# Patient Record
Sex: Male | Born: 1980 | Race: White | Hispanic: No | Marital: Single | Smoking: Current every day smoker
Health system: Southern US, Community
[De-identification: ages and names within clinical notes are randomized; demographics above are authoritative.]

## PROBLEM LIST (undated history)

## (undated) DIAGNOSIS — R319 Hematuria, unspecified: Secondary | ICD-10-CM

## (undated) DIAGNOSIS — K219 Gastro-esophageal reflux disease without esophagitis: Secondary | ICD-10-CM

## (undated) HISTORY — PX: HERNIA REPAIR: SHX51

---

## 2009-11-11 ENCOUNTER — Ambulatory Visit: Payer: Self-pay | Admitting: Family Medicine

## 2010-12-16 ENCOUNTER — Ambulatory Visit: Payer: Self-pay | Admitting: Internal Medicine

## 2011-07-31 ENCOUNTER — Emergency Department (HOSPITAL_COMMUNITY): Payer: BC Managed Care – PPO

## 2011-07-31 ENCOUNTER — Encounter (HOSPITAL_COMMUNITY): Payer: Self-pay

## 2011-07-31 ENCOUNTER — Emergency Department (HOSPITAL_COMMUNITY)
Admission: EM | Admit: 2011-07-31 | Discharge: 2011-07-31 | Disposition: A | Discharge status: Home Health | Payer: BC Managed Care – PPO | Attending: Emergency Medicine | Admitting: Emergency Medicine

## 2011-07-31 DIAGNOSIS — K2970 Gastritis, unspecified, without bleeding: Secondary | ICD-10-CM

## 2011-07-31 DIAGNOSIS — L259 Unspecified contact dermatitis, unspecified cause: Secondary | ICD-10-CM | POA: Insufficient documentation

## 2011-07-31 DIAGNOSIS — R221 Localized swelling, mass and lump, neck: Secondary | ICD-10-CM | POA: Insufficient documentation

## 2011-07-31 DIAGNOSIS — Z79899 Other long term (current) drug therapy: Secondary | ICD-10-CM | POA: Insufficient documentation

## 2011-07-31 DIAGNOSIS — R220 Localized swelling, mass and lump, head: Secondary | ICD-10-CM | POA: Insufficient documentation

## 2011-07-31 DIAGNOSIS — R1110 Vomiting, unspecified: Secondary | ICD-10-CM | POA: Insufficient documentation

## 2011-07-31 DIAGNOSIS — R10811 Right upper quadrant abdominal tenderness: Secondary | ICD-10-CM | POA: Insufficient documentation

## 2011-07-31 DIAGNOSIS — R112 Nausea with vomiting, unspecified: Secondary | ICD-10-CM | POA: Insufficient documentation

## 2011-07-31 DIAGNOSIS — R1011 Right upper quadrant pain: Secondary | ICD-10-CM | POA: Insufficient documentation

## 2011-07-31 DIAGNOSIS — K219 Gastro-esophageal reflux disease without esophagitis: Secondary | ICD-10-CM | POA: Insufficient documentation

## 2011-07-31 DIAGNOSIS — T7840XA Allergy, unspecified, initial encounter: Secondary | ICD-10-CM

## 2011-07-31 DIAGNOSIS — F17200 Nicotine dependence, unspecified, uncomplicated: Secondary | ICD-10-CM | POA: Insufficient documentation

## 2011-07-31 HISTORY — DX: Hematuria, unspecified: R31.9

## 2011-07-31 HISTORY — DX: Gastro-esophageal reflux disease without esophagitis: K21.9

## 2011-07-31 LAB — URINALYSIS, ROUTINE W REFLEX MICROSCOPIC
Glucose, UA: NEGATIVE mg/dL
Leukocytes, UA: NEGATIVE
Nitrite: NEGATIVE
Protein, ur: NEGATIVE mg/dL
Urobilinogen, UA: 0.2 mg/dL (ref 0.0–1.0)

## 2011-07-31 LAB — DIFFERENTIAL
Basophils Absolute: 0 10*3/uL (ref 0.0–0.1)
Eosinophils Relative: 0 % (ref 0–5)
Lymphocytes Relative: 8 % — ABNORMAL LOW (ref 12–46)
Monocytes Absolute: 0.8 10*3/uL (ref 0.1–1.0)

## 2011-07-31 LAB — COMPREHENSIVE METABOLIC PANEL
AST: 36 U/L (ref 0–37)
BUN: 11 mg/dL (ref 6–23)
CO2: 27 mEq/L (ref 19–32)
Calcium: 9.9 mg/dL (ref 8.4–10.5)
Creatinine, Ser: 0.95 mg/dL (ref 0.50–1.35)
GFR calc Af Amer: >90 mL/min (ref >90)
GFR calc non Af Amer: >90 mL/min (ref >90)

## 2011-07-31 LAB — CBC
HCT: 46.1 % (ref 39.0–52.0)
MCHC: 35.4 g/dL (ref 30.0–36.0)
MCV: 89 fL (ref 78.0–100.0)
RDW: 11.9 % (ref 11.5–15.5)
WBC: 17.8 10*3/uL — ABNORMAL HIGH (ref 4.0–10.5)

## 2011-07-31 LAB — LIPASE, BLOOD: Lipase: 14 U/L (ref 11–59)

## 2011-07-31 MED ORDER — ONDANSETRON HCL 4 MG PO TABS: 4.0000 mg | ORAL_TABLET | Freq: Four times a day (QID) | ORAL | Status: AC

## 2011-07-31 MED ORDER — METHYLPREDNISOLONE SODIUM SUCC 125 MG IJ SOLR
125.0000 mg | Freq: Once | INTRAMUSCULAR | Status: AC
  Administered 2011-07-31: 125 mg via INTRAVENOUS
  Filled 2011-07-31: qty 2

## 2011-07-31 MED ORDER — MORPHINE SULFATE 4 MG/ML IJ SOLN
4.0000 mg | Freq: Once | INTRAMUSCULAR | Status: AC
  Administered 2011-07-31: 4 mg via INTRAVENOUS
  Filled 2011-07-31: qty 1

## 2011-07-31 MED ORDER — PANTOPRAZOLE SODIUM 20 MG PO TBEC: 40.0000 mg | DELAYED_RELEASE_TABLET | Freq: Every day | ORAL | Status: AC

## 2011-07-31 MED ORDER — ONDANSETRON HCL 4 MG/2ML IJ SOLN
4.0000 mg | Freq: Once | INTRAMUSCULAR | Status: AC
  Administered 2011-07-31: 4 mg via INTRAVENOUS
  Filled 2011-07-31: qty 2

## 2011-07-31 MED ORDER — FAMOTIDINE IN NACL 20-0.9 MG/50ML-% IV SOLN
20.0000 mg | Freq: Once | INTRAVENOUS | Status: AC
  Administered 2011-07-31: 20 mg via INTRAVENOUS
  Filled 2011-07-31: qty 50

## 2011-07-31 MED ORDER — DIPHENHYDRAMINE HCL 25 MG PO CAPS: 25.0000 mg | ORAL_CAPSULE | Freq: Four times a day (QID) | ORAL | Status: AC | PRN

## 2011-07-31 MED ORDER — SODIUM CHLORIDE 0.9 % IV BOLUS (SEPSIS)
1000.0000 mL | Freq: Once | INTRAVENOUS | Status: AC
  Administered 2011-07-31: 1000 mL via INTRAVENOUS

## 2011-07-31 MED ORDER — DIPHENHYDRAMINE HCL 50 MG/ML IJ SOLN
50.0000 mg | Freq: Once | INTRAMUSCULAR | Status: AC
  Administered 2011-07-31: 50 mg via INTRAVENOUS
  Filled 2011-07-31: qty 1

## 2011-07-31 MED ORDER — PREDNISONE 10 MG PO TABS: 20.0000 mg | ORAL_TABLET | Freq: Every day | ORAL | Status: DC

## 2011-07-31 NOTE — ED Provider Notes (Signed)
History     CSN: [id number]  Arrival date & time 07/31/11  4098   First MD Initiated Contact with Patient 07/31/11 0820      Chief Complaint  Patient presents with  . Rash  . Emesis  . Abdominal Pain    (Consider location/radiation/quality/duration/timing/severity/associated sxs/prior treatment) HPI Pt with rash that started on his face yesterday and has since spread to his entire body. Non-painful. No airway compromise. No new medications or exposures. No prev allergic reactions. Pt has on and off RUQ pain associated with N/V in the past and has been diagnosed wit GERD. He had a similar exacerbation this morning with RUQ pain and several episodes of vomiting. Cont to have mild RUQ pain. No fevers, diarrhea. Pain is exacerbated by eating.  Past Medical History  Diagnosis Date  . Hematuria - cause not known   . Acid reflux     Past Surgical History  Procedure Date  . Hernia repair     History reviewed. No pertinent family history.  History  Substance Use Topics  . Smoking status: Current Everyday Smoker -- 1.0 packs/day  . Smokeless tobacco: Not on file  . Alcohol Use: Yes     occasional      Review of Systems  Constitutional: Negative for fever and chills.  HENT: Positive for facial swelling. Negative for trouble swallowing.   Respiratory: Negative for chest tightness, shortness of breath and wheezing.   Cardiovascular: Negative for chest pain and leg swelling.  Gastrointestinal: Positive for nausea, vomiting and abdominal pain. Negative for diarrhea and blood in stool.  Skin: Positive for rash. Negative for wound.  Neurological: Negative for dizziness, weakness, numbness and headaches.    Allergies  Review of patient's allergies indicates no known allergies.  Home Medications   Current Outpatient Rx  Name Route Sig Dispense Refill  . RANITIDINE HCL 150 MG PO TABS Oral Take 150 mg by mouth 2 (two) times daily.      BP 131/74  Pulse 73  Temp(Src) 98 F  (36.7 C) (Oral)  Resp 20  SpO2 98%  Physical Exam  Nursing note and vitals reviewed. Constitutional: He is oriented to person, place, and time. He appears well-developed and well-nourished. No distress.  HENT:  Head: Normocephalic and atraumatic.  Mouth/Throat: Oropharynx is clear and moist. No oropharyngeal exudate.       No tongue or lip swelling  Eyes: EOM are normal. Pupils are equal, round, and reactive to light.  Neck: Normal range of motion. Neck supple.  Cardiovascular: Normal rate and regular rhythm.   Pulmonary/Chest: Effort normal and breath sounds normal. No stridor. No respiratory distress. He has no wheezes. He has no rales.  Abdominal: Soft. Bowel sounds are normal. He exhibits no mass. There is tenderness (mild RUQ tenderness to palpation). There is no rebound and no guarding.  Musculoskeletal: Normal range of motion. He exhibits no edema and no tenderness.  Neurological: He is alert and oriented to person, place, and time.       5/5 motor, sensation intact  Skin: Skin is warm and dry. Rash (blanching, erythematous macular and papular rash concentrated on face but diffuse over entire body. Mild periorbital edema) noted. No erythema.  Psychiatric: He has a normal mood and affect. His behavior is normal.    ED Course  Procedures (including critical care time)   Labs Reviewed  CBC  DIFFERENTIAL  COMPREHENSIVE METABOLIC PANEL  LIPASE, BLOOD  URINALYSIS, ROUTINE W REFLEX MICROSCOPIC   No results found.  No diagnosis found.    MDM  Pt rash has significantly improved. Pt states he is feeling better. Advised to f/u with GI. Return for concerns       Loren Racer, MD 07/31/11 1540

## 2011-07-31 NOTE — ED Notes (Signed)
Pt states that starting a couple of days ago he has broken out with a red blotchy rash all over his body. He states that it started with a rash on his face and eyelids and has now progressed all over his body. He states that he has been having the chills and sweats and has vomited a few times since 0430 this morning. He took some pain killers last night for a toothache in the left upper jaw area. The meds were tramadol and pt states that he had the rash prior to taking the meds. Pt states that he was having some similar abdominal pain on the right side and was seen at a clinic but has head no results from it. Pt is alert and oriented. Pt has hx of kidney problems. He states that he has been having right sided abdominal pain that is worse after he eats. Last po intake was last night and last bm was yesterday. His rash extends on his face and his face looks swollen. Airway intact.

## 2011-08-02 DIAGNOSIS — L509 Urticaria, unspecified: Secondary | ICD-10-CM | POA: Insufficient documentation

## 2013-03-01 IMAGING — US US ABDOMEN COMPLETE
1 series · 14 of 25 positions shown · non-contrast
Comparison: No comparison studies available.

CLINICAL DATA: Right upper quadrant pain.

ABDOMEN ULTRASOUND
TECHNIQUE: Complete abdominal ultrasound examination was performed
including evaluation of the liver, gallbladder, bile ducts,
pancreas, kidneys, spleen, IVC, and abdominal aorta.

[Series 1: us abdomen complete · 0.29mm/px · 14 of 75 slices shown]
[im 1/75]
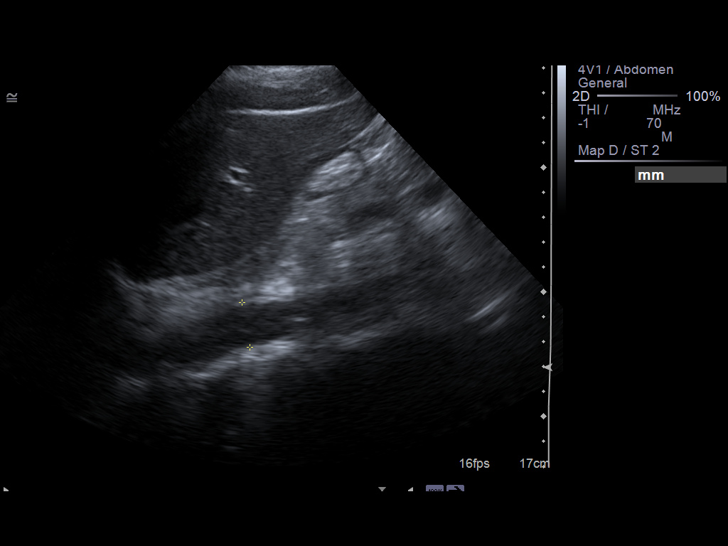
[im 7/75]
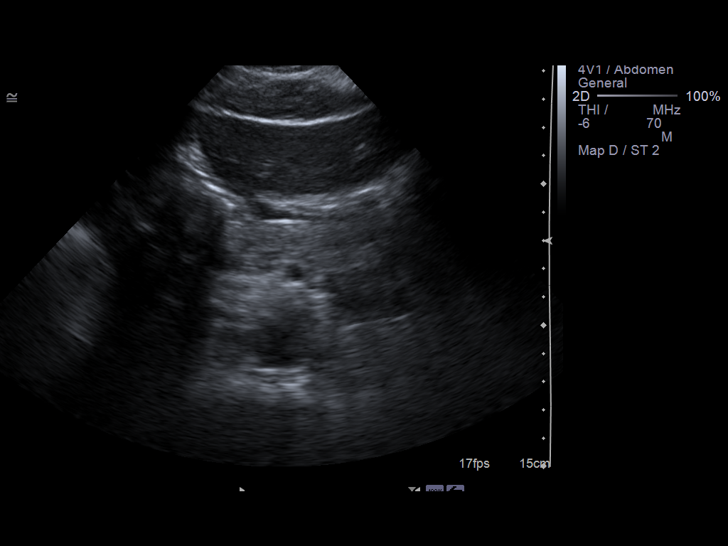
[im 13/75]
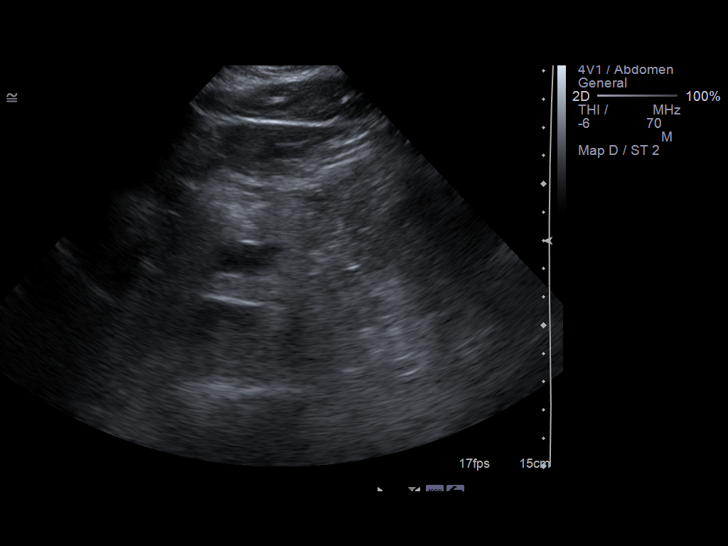
[im 19/75]
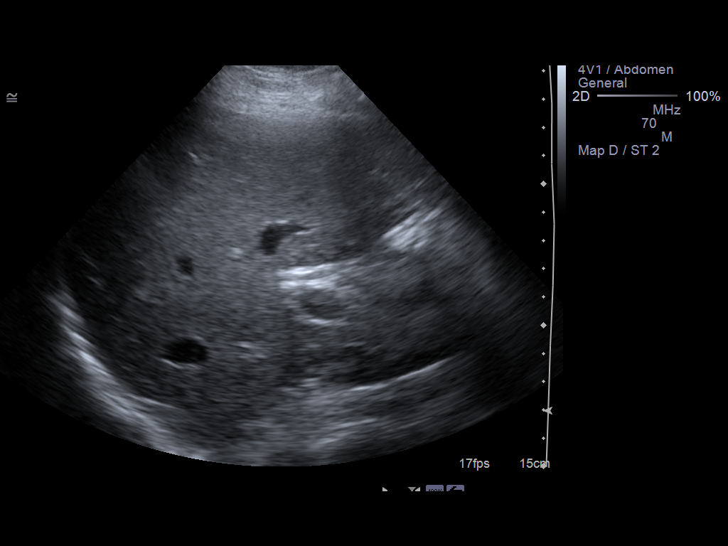
[im 25/75]
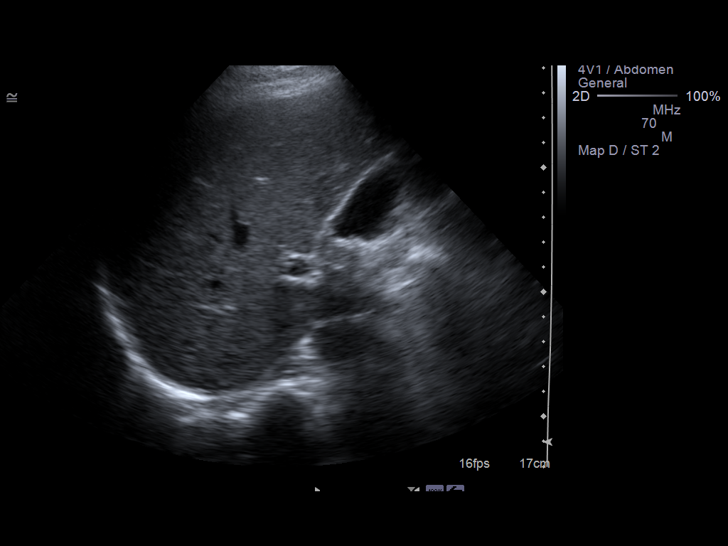
[im 28/75]
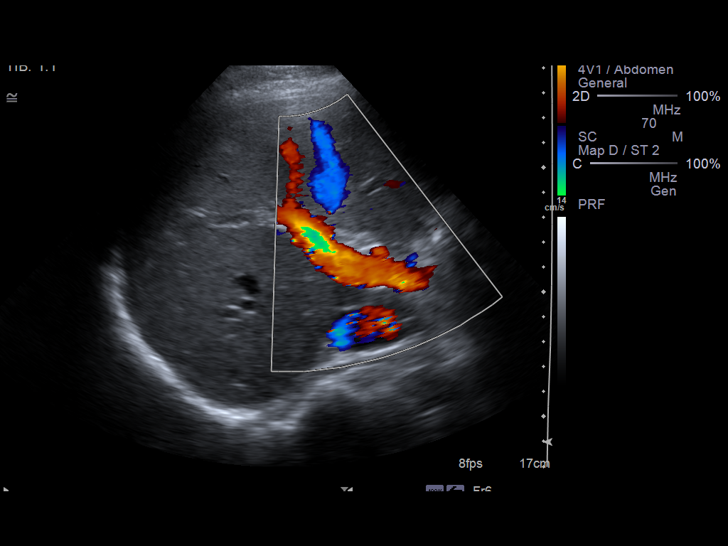
[im 34/75]
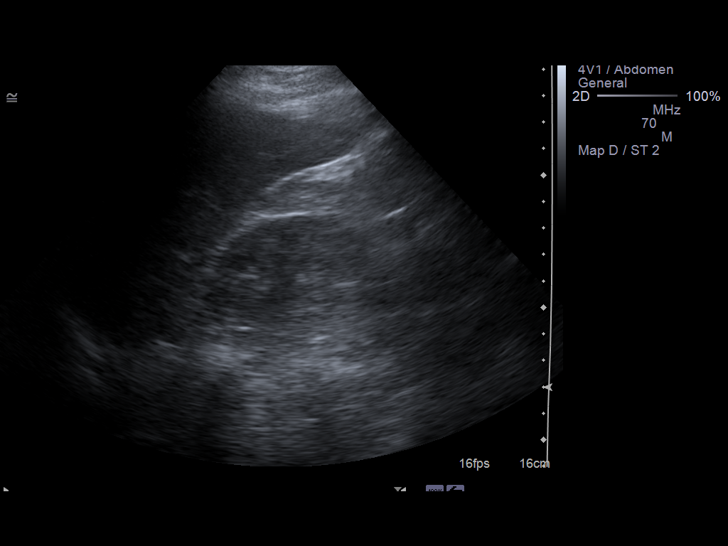
[im 41/75]
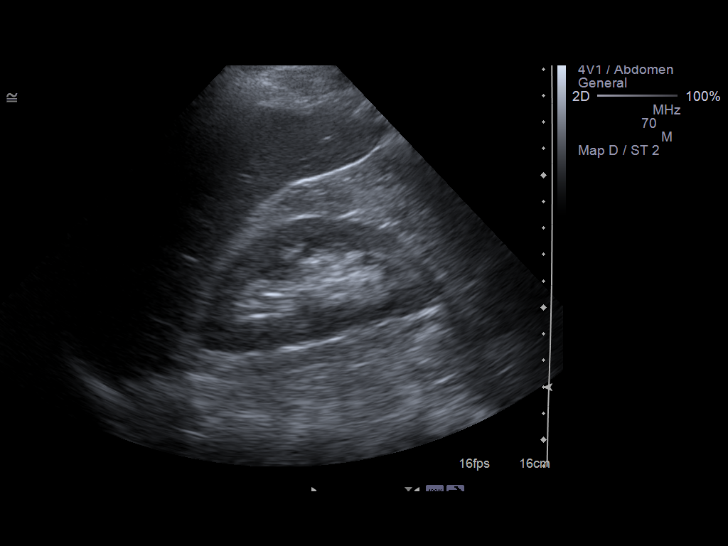
[im 47/75]
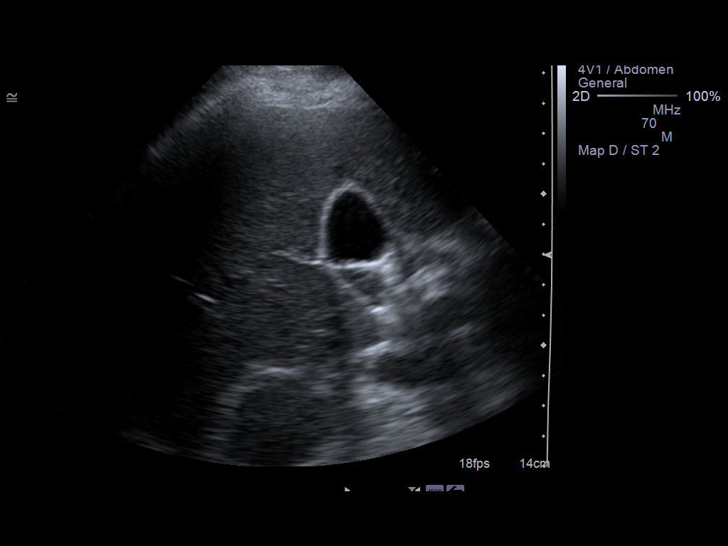
[im 50/75]
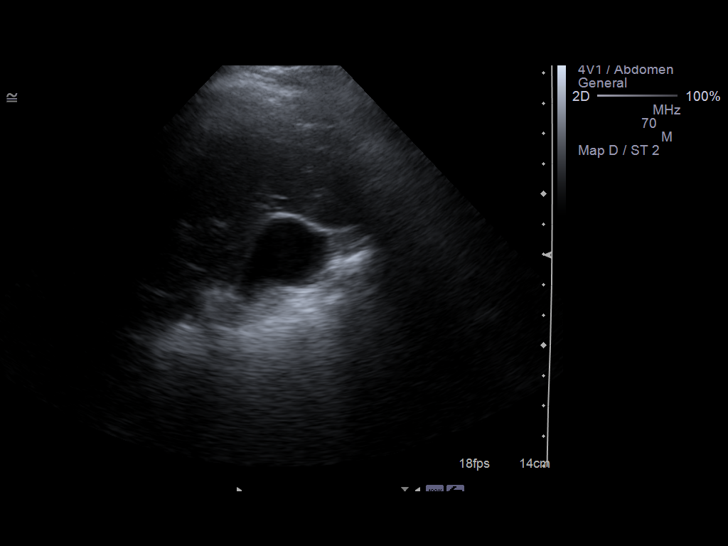
[im 56/75]
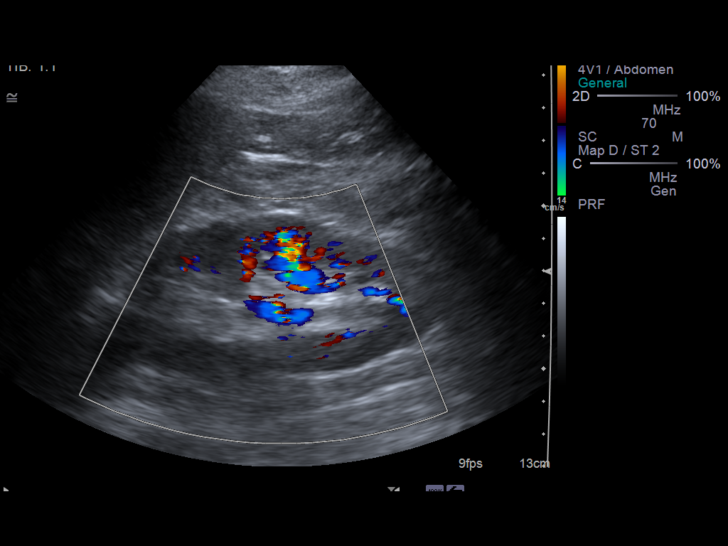
[im 62/75]
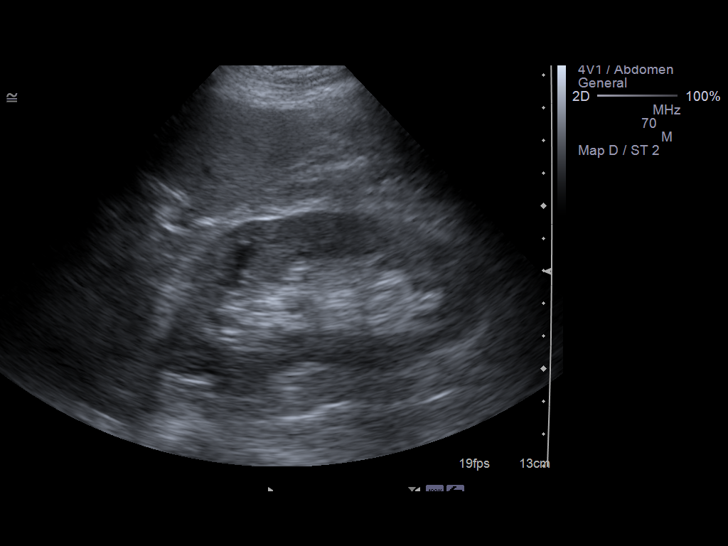
[im 68/75]
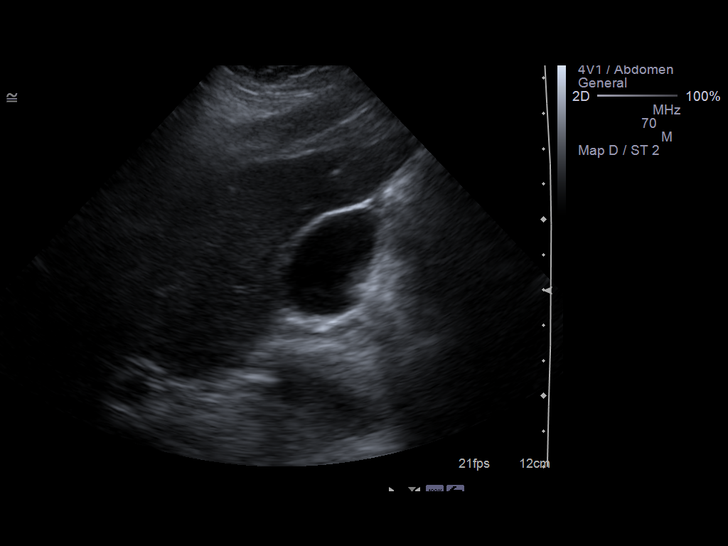
[im 75/75]
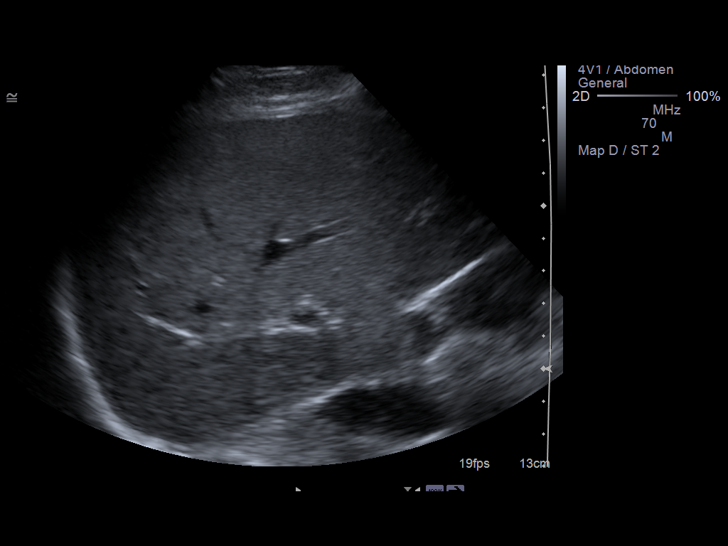

[14 of 25 positions shown; findings below may reference images not displayed]

FINDINGS: Gallbladder:  There is no evidence for gallstones.  No gallbladder
wall thickening or pericholecystic fluid.  The sonographer reports
no sonographic Murphy's sign.

Common Bile Duct:  Nondilated at 4 mm diameter.

Liver:  Normal.  No focal parenchymal abnormality.  No biliary
dilation.

IVC:  Normal.

Pancreas:  Tail is obscured by overlying bowel gas.  Otherwise
unremarkable.

Spleen:  Normal.

Right kidney:  10.0 cm in long axis.  Normal.

Left kidney:  9.7 cm in long axis.  Normal.

Abdominal Aorta:  No aneurysm.
IMPRESSION: Unremarkable abdominal ultrasound.  No findings to explain the
patient's history of right upper quadrant pain.

## 2015-05-24 ENCOUNTER — Encounter (HOSPITAL_COMMUNITY): Payer: Self-pay | Admitting: Emergency Medicine

## 2015-05-24 ENCOUNTER — Emergency Department (HOSPITAL_COMMUNITY)
Admission: EM | Admit: 2015-05-24 | Discharge: 2015-05-24 | Disposition: A | Discharge status: Home / Self Care | Payer: BLUE CROSS/BLUE SHIELD | Attending: Emergency Medicine | Admitting: Emergency Medicine

## 2015-05-24 DIAGNOSIS — Z880 Allergy status to penicillin: Secondary | ICD-10-CM | POA: Diagnosis not present

## 2015-05-24 DIAGNOSIS — M791 Myalgia: Secondary | ICD-10-CM | POA: Diagnosis not present

## 2015-05-24 DIAGNOSIS — K219 Gastro-esophageal reflux disease without esophagitis: Secondary | ICD-10-CM | POA: Diagnosis not present

## 2015-05-24 DIAGNOSIS — L519 Erythema multiforme, unspecified: Secondary | ICD-10-CM | POA: Diagnosis not present

## 2015-05-24 DIAGNOSIS — R000 Tachycardia, unspecified: Secondary | ICD-10-CM | POA: Insufficient documentation

## 2015-05-24 DIAGNOSIS — F17200 Nicotine dependence, unspecified, uncomplicated: Secondary | ICD-10-CM | POA: Insufficient documentation

## 2015-05-24 DIAGNOSIS — E860 Dehydration: Secondary | ICD-10-CM | POA: Diagnosis not present

## 2015-05-24 DIAGNOSIS — R21 Rash and other nonspecific skin eruption: Secondary | ICD-10-CM | POA: Diagnosis present

## 2015-05-24 LAB — CBC WITH DIFFERENTIAL/PLATELET
BASOS ABS: 0 10*3/uL (ref 0.0–0.1)
Basophils Relative: 0 %
EOS PCT: 0 %
Eosinophils Absolute: 0 10*3/uL (ref 0.0–0.7)
HCT: 44.6 % (ref 39.0–52.0)
Hemoglobin: 15.1 g/dL (ref 13.0–17.0)
LYMPHS ABS: 1.5 10*3/uL (ref 0.7–4.0)
LYMPHS PCT: 11 %
MCH: 30.9 pg (ref 26.0–34.0)
MCHC: 33.9 g/dL (ref 30.0–36.0)
MCV: 91.4 fL (ref 78.0–100.0)
MONO ABS: 0.3 10*3/uL (ref 0.1–1.0)
MONOS PCT: 2 %
Neutro Abs: 12.3 10*3/uL — ABNORMAL HIGH (ref 1.7–7.7)
Neutrophils Relative %: 87 %
PLATELETS: 341 10*3/uL (ref 150–400)
RBC: 4.88 MIL/uL (ref 4.22–5.81)
RDW: 13 % (ref 11.5–15.5)
WBC: 14.1 10*3/uL — ABNORMAL HIGH (ref 4.0–10.5)

## 2015-05-24 LAB — COMPREHENSIVE METABOLIC PANEL
ALT: 89 U/L — ABNORMAL HIGH (ref 17–63)
ANION GAP: 16 — AB (ref 5–15)
AST: 43 U/L — ABNORMAL HIGH (ref 15–41)
Albumin: 3.7 g/dL (ref 3.5–5.0)
Alkaline Phosphatase: 52 U/L (ref 38–126)
BUN: 19 mg/dL (ref 6–20)
CHLORIDE: 95 mmol/L — AB (ref 101–111)
CO2: 22 mmol/L (ref 22–32)
Calcium: 9.3 mg/dL (ref 8.9–10.3)
Creatinine, Ser: 1.46 mg/dL — ABNORMAL HIGH (ref 0.61–1.24)
Glucose, Bld: 107 mg/dL — ABNORMAL HIGH (ref 65–99)
POTASSIUM: 5.1 mmol/L (ref 3.5–5.1)
Sodium: 133 mmol/L — ABNORMAL LOW (ref 135–145)
TOTAL PROTEIN: 6.4 g/dL — AB (ref 6.5–8.1)
Total Bilirubin: 2.6 mg/dL — ABNORMAL HIGH (ref 0.3–1.2)

## 2015-05-24 LAB — C-REACTIVE PROTEIN: CRP: 20 mg/dL — AB (ref <1.0)

## 2015-05-24 LAB — SEDIMENTATION RATE: SED RATE: 18 mm/h — AB (ref 0–16)

## 2015-05-24 LAB — RAPID STREP SCREEN (MED CTR MEBANE ONLY): Streptococcus, Group A Screen (Direct): NEGATIVE

## 2015-05-24 MED ORDER — FAMOTIDINE IN NACL 20-0.9 MG/50ML-% IV SOLN
20.0000 mg | Freq: Once | INTRAVENOUS | Status: AC
  Administered 2015-05-24: 20 mg via INTRAVENOUS
  Filled 2015-05-24: qty 50

## 2015-05-24 MED ORDER — FAMOTIDINE 40 MG PO TABS: 40.0000 mg | ORAL_TABLET | Freq: Every day | ORAL | Status: DC

## 2015-05-24 MED ORDER — KETOROLAC TROMETHAMINE 30 MG/ML IJ SOLN
30.0000 mg | Freq: Once | INTRAMUSCULAR | Status: AC
  Administered 2015-05-24: 30 mg via INTRAVENOUS
  Filled 2015-05-24: qty 1

## 2015-05-24 MED ORDER — DIPHENHYDRAMINE HCL 50 MG/ML IJ SOLN
25.0000 mg | Freq: Once | INTRAMUSCULAR | Status: AC
  Administered 2015-05-24: 25 mg via INTRAVENOUS
  Filled 2015-05-24: qty 1

## 2015-05-24 MED ORDER — PREDNISONE 20 MG PO TABS: ORAL_TABLET | ORAL | Status: DC

## 2015-05-24 MED ORDER — HYDROXYZINE HCL 25 MG PO TABS: 25.0000 mg | ORAL_TABLET | Freq: Four times a day (QID) | ORAL | Status: DC | PRN

## 2015-05-24 MED ORDER — MORPHINE SULFATE (PF) 4 MG/ML IV SOLN
4.0000 mg | Freq: Once | INTRAVENOUS | Status: AC
  Administered 2015-05-24: 4 mg via INTRAVENOUS
  Filled 2015-05-24: qty 1

## 2015-05-24 MED ORDER — METHYLPREDNISOLONE SODIUM SUCC 125 MG IJ SOLR
125.0000 mg | Freq: Once | INTRAMUSCULAR | Status: AC
  Administered 2015-05-24: 125 mg via INTRAVENOUS
  Filled 2015-05-24: qty 2

## 2015-05-24 MED ORDER — SODIUM CHLORIDE 0.9 % IV BOLUS (SEPSIS)
1000.0000 mL | Freq: Once | INTRAVENOUS | Status: AC
  Administered 2015-05-24: 1000 mL via INTRAVENOUS

## 2015-05-24 NOTE — ED Provider Notes (Signed)
CSN: [id number]     Arrival date & time 05/24/15  0030 History  By signing my name below, I, Christopher Chan, attest that this documentation has been prepared under the direction and in the presence of Christopher Rice, MD. Electronically Signed: Hansel Chan, ED Scribe. 05/24/2015. 1:31 AM.    Chief Complaint  Patient presents with  . Urticaria  . Allergic Reaction   The history is provided by the patient. No language interpreter was used.    HPI Comments: Christopher Chan is a 35 y.o. male who presents to the Emergency Department complaining of moderate, worsening pruritic and painful generalized rash onset 2 days ago with associated generalized arthralgias and swelling of the hands, chills. Per pt, the rash started at his lower back. Pt states he was seen in the ER in [place] for the rash 2 days ago and was given Benadryl and Pepcid with temporary relief. He notes that he has been continuously taking Benadryl since onset with little to no relief. Pt states h/o similar symptoms with no known causes. No recent outdoor exposure or tick bites. No new soaps, lotions, detergents, foods, animals, plants. Pt notes he was dx with otitis media 2 weeks ago and finished his rx of amoxicillin 4 days ago. He denies fever, sore throat, throat swelling, SOB.   Past Medical History  Diagnosis Date  . Hematuria - cause not known   . Acid reflux    Past Surgical History  Procedure Laterality Date  . Hernia repair     No family history on file. Social History  Substance Use Topics  . Smoking status: Current Every Day Smoker -- 1.00 packs/day  . Smokeless tobacco: None  . Alcohol Use: Yes     Comment: occasional    Review of Systems  Constitutional: Positive for chills and fatigue. Negative for fever.  HENT: Negative for sore throat, trouble swallowing and voice change.   Respiratory: Negative for chest tightness, shortness of breath, wheezing and stridor.   Cardiovascular: Negative for chest pain.   Gastrointestinal: Negative for nausea, vomiting, abdominal pain, diarrhea, constipation and blood in stool.  Genitourinary: Negative for dysuria, frequency, hematuria and flank pain.  Musculoskeletal: Positive for myalgias, joint swelling and arthralgias. Negative for back pain, neck pain and neck stiffness.  Skin: Positive for rash. Negative for pallor and wound.  Neurological: Negative for dizziness, weakness, light-headedness, numbness and headaches.  All other systems reviewed and are negative.  Allergies  Amoxicillin  Home Medications   Prior to Admission medications   Medication Sig Start Date End Date Taking? Authorizing Provider  diphenhydrAMINE (BENADRYL) 25 MG tablet Take 50 mg by mouth every 4 (four) hours as needed for itching.   Yes Historical Provider, MD  EPINEPHrine 0.3 mg/0.3 mL IJ SOAJ injection Inject 0.3 mg into the muscle once.   Yes Historical Provider, MD  famotidine (PEPCID) 40 MG tablet Take 1 tablet (40 mg total) by mouth daily. 05/24/15   Christopher Rice, MD  hydrOXYzine (ATARAX/VISTARIL) 25 MG tablet Take 1 tablet (25 mg total) by mouth every 6 (six) hours as needed for itching. 05/24/15   Christopher Rice, MD  predniSONE (DELTASONE) 20 MG tablet 3 tabs po day one, then 2 po daily x 4 days 05/24/15   Christopher Rice, MD   BP 108/67 mmHg  Pulse 69  Temp(Src) 98.8 F (37.1 C) (Oral)  Resp 18  Ht 5\' 9"  (1.753 m)  Wt 182 lb (82.555 kg)  BMI 26.86 kg/m2  SpO2 97% Physical Exam  Constitutional: He is oriented to person, place, and time. He appears well-developed and well-nourished. No distress.  HENT:  Head: Normocephalic and atraumatic.  Mouth/Throat: Oropharynx is clear and moist.  Erythematous oropharynx without tonsillar hypertrophy or exudates. Uvula is midline. No facial or intraoral swelling  Eyes: EOM are normal. Pupils are equal, round, and reactive to light.  Neck: Normal range of motion. Neck supple.  Cardiovascular: Regular rhythm.  Exam reveals no  gallop and no friction rub.   No murmur heard. Tachycardia  Pulmonary/Chest: Effort normal and breath sounds normal. No stridor. No respiratory distress. He has no wheezes. He has no rales. He exhibits no tenderness.  Abdominal: Soft. Bowel sounds are normal. He exhibits no distension and no mass. There is no tenderness. There is no rebound and no guarding.  Musculoskeletal: Normal range of motion. He exhibits tenderness. He exhibits no edema.  Patient with bilateral tender wrist and elbows. Warm to touch with overlying erythema. Distal pulses intact.  Lymphadenopathy:    He has no cervical adenopathy.  Neurological: He is alert and oriented to person, place, and time.  5/5 motor in all extremities. Sensation is fully intact.  Skin: Skin is warm and dry. Rash noted. No erythema.  Patient with raised erythematous rash with central clearing. It appears coalesced in the lumbar region. Patient does have some erythematous nodules over anterior tibial region  Psychiatric: He has a normal mood and affect. His behavior is normal.  Nursing note and vitals reviewed.   ED Course  Procedures (including critical care time) DIAGNOSTIC STUDIES: Oxygen Saturation is 98% on RA, normal by my interpretation.    COORDINATION OF CARE: 1:14 AM Discussed treatment plan with pt at bedside and pt agreed to plan.   Labs Review Labs Reviewed  CBC WITH DIFFERENTIAL/PLATELET - Abnormal; Notable for the following:    WBC 14.1 (*)    Neutro Abs 12.3 (*)    All other components within normal limits  COMPREHENSIVE METABOLIC PANEL - Abnormal; Notable for the following:    Sodium 133 (*)    Chloride 95 (*)    Glucose, Bld 107 (*)    Creatinine, Ser 1.46 (*)    Total Protein 6.4 (*)    AST 43 (*)    ALT 89 (*)    Total Bilirubin 2.6 (*)    Anion gap 16 (*)    All other components within normal limits  SEDIMENTATION RATE - Abnormal; Notable for the following:    Sed Rate 18 (*)    All other components within  normal limits  C-REACTIVE PROTEIN - Abnormal; Notable for the following:    CRP 20.0 (*)    All other components within normal limits  ROCKY MTN SPOTTED FVR ABS PNL(IGG+IGM) - Abnormal; Notable for the following:    RMSF IgG Positive (*)    RMSF IgM 1.31 (*)    All other components within normal limits  RMSF, IGG, IFA - Abnormal; Notable for the following:    RMSF, IGG, IFA 1:128 (*)    All other components within normal limits  RAPID STREP SCREEN (NOT AT [facility])  CULTURE, GROUP A STREP  B. BURGDORFI ANTIBODIES    I have personally reviewed and evaluated these lab results as part of my medical decision-making.   EKG Interpretation   Date/Time:  Monday May 24 2015 01:59:14 EST Ventricular Rate:  101 PR Interval:  127 QRS Duration: 105 QT Interval:  328 QTC Calculation: 425 R Axis:   43 Text Interpretation:  Sinus tachycardia RSR' in V1 or V2, right VCD or RVH  Borderline ST elevation, lateral leads ED PHYSICIAN INTERPRETATION  AVAILABLE IN [facility] Confirmed by TEST, Record ([id number]) on 05/25/2015  7:09:02 AM      MDM   Final diagnoses:  Erythema multiforme  Dehydration    I personally performed the services described in this documentation, which was scribed in my presence. The recorded information has been reviewed and is accurate.   Patient's rash has significantly improved over his stay of 7 hours in the emergency department. Suspect erythema multiforme due to antibiotics. Patient has stopped taking antibiotics. He's been instructed to return immediately for any worsening of his symptoms. He says the need to follow-up with a primary physician and possible allergist referral.  Christopher Rice, MD 05/26/15 [phone]

## 2015-05-24 NOTE — ED Notes (Signed)
Pt. presents with persistent generalized itchy rashes/hives onset Saturday unrellieved [place]. Respirations unlabored / no oral swelling .

## 2015-05-24 NOTE — Discharge Instructions (Signed)
Erythema Multiforme Erythema multiforme is a rash that usually occurs on the skin, but can also occur on the lips and on the inside of the mouth. It is usually a mild condition that goes away on its own. It most often affects young adults and children. The rash shows up suddenly and often lasts 1-4 weeks. In some cases, the rash may come back again after clearing up. CAUSES  The cause of erythema multiforme may be an overreaction by the body's immune system to a trigger.  Common triggers include:   Infection, most commonly by the cold sore virus (human herpes virus, HSV), bacteria, or fungus. Less common triggers include:   Medicines.   Other illnesses.  In some cases, the cause may not be known.  SIGNS AND SYMPTOMS  The rash from erythema multiforme shows up suddenly. It may appear days after exposure to the trigger. It may start as small, red, round or oval marks that become bumps or raised welts over 24-48 hours. These bumps may resemble a target or a "bull's eye." These can spread and be quite large (about 1 inch [2.5 cm]). There may be mild itching or burning of the skin at first.  These skin changes usually appear first on the backs of the hands. They may then spread to the tops of the feet, the arms, the elbows, the knees, the palms, and the soles of the feet. There may be a mild rash on the lips and lining of the mouth. The skin rash may show up in waves over a few days.  It may take 2-4 weeks for the rash to go away. The rash may return at a later time.  DIAGNOSIS  Diagnosis of erythema multiforme is usually made based on a physical exam and medical history. To help confirm the diagnosis, a small piece of skin tissue is sometimes removed (skin biopsy) so it can be examined under a microscope by a specialist (pathologist). TREATMENT  Most episodes of erythema multiforme heal on their own. Treatment may not be needed. Your health care provider will recommend removing or avoiding the  trigger if possible. If the trigger is an infection or other illness, you may receive treatment for that infection or illness. You may also be given medicine for itching. Other medicines may be used for severe cases or to help prevent repeat bouts of erythema multiforme.  HOME CARE INSTRUCTIONS   Take medicines only as directed by your health care provider.   If possible, avoid known triggers.   If a medicine was your trigger, be sure to notify all of your health care providers. You should avoid this medicine or any like it in the future.   If your trigger was a herpes virus infection, use sunscreen lotion and sunscreen-containing lip balm to prevent sunlight triggered outbreaks of herpes virus.   Apply moist compresses as needed to help control itching. Cool or warm baths may also help. Avoid hot baths or showers.   Eat soft foods if you have mouth sores.   Keep all follow-up visits as directed by your health care provider. This is important.  SEEK MEDICAL CARE IF:   Your rash shows up again in the future.  You have a fever. SEEK IMMEDIATE MEDICAL CARE IF:   You develop redness and swelling on your lips or in your mouth.  You have a burning feeling on your lips or in your mouth.  You develop blisters or open sores on your mouth, lips, vagina, penis, or  anus.  You have eye pain, or you have redness or drainage in your eye.  You develop blisters on your skin.  You have difficulty breathing.  You have difficulty swallowing, or you start drooling.  You have blood in your urine.  You have pain with urination.   This information is not intended to replace advice given to you by your health care provider. Make sure you discuss any questions you have with your health care provider.   Document Released: 05/08/2005 Document Revised: 05/29/2014 Document Reviewed: 12/30/2013 Elsevier Interactive Patient Education 2016 [employer].    Emergency Department Resource  Guide 1) Find a Doctor and Pay Out of Pocket Although you won't have to find out who is covered by your insurance plan, it is a good idea to ask around and get recommendations. You will then need to call the office and see if the doctor you have chosen will accept you as a new patient and what types of options they offer for patients who are self-pay. Some doctors offer discounts or will set up payment plans for their patients who do not have insurance, but you will need to ask so you aren't surprised when you get to your appointment.  2) Contact Your Local Health Department Not all health departments have doctors that can see patients for sick visits, but many do, so it is worth a call to see if yours does. If you don't know where your local health department is, you can check in your phone book. The CDC also has a tool to help you locate your state's health department, and many state websites also have listings of all of their local health departments.  3) Find a [facility] If your illness is not likely to be very severe or complicated, you may want to try a walk in clinic. These are popping up all over the country in pharmacies, drugstores, and shopping centers. They're usually staffed by nurse practitioners or physician assistants that have been trained to treat common illnesses and complaints. They're usually fairly quick and inexpensive. However, if you have serious medical issues or chronic medical problems, these are probably not your best option.  No Primary Care Doctor: - Call Health Connect at  [phone] - they can help you locate a primary care doctor that  accepts your insurance, provides certain services, etc. - Physician Referral Service- [phone]  Chronic Pain Problems: Organization         Address  Phone   Notes  [facility]  [phone] Patients need to be referred by their primary care doctor.   Medication Assistance: Organization          Address  Phone   Notes  [facility] [address] [place], [place] [address] [phone] --Must be a resident of [facility] -- Must have NO insurance coverage whatsoever (no Medicaid/ Medicare, etc.) -- The pt. MUST have a primary care doctor that directs their care regularly and follows them in the community   MedAssist  [phone]   [employer]  [phone]    Agencies that provide inexpensive medical care: Organization         Address  Phone   Notes  [place]  [phone]   [facility]    [phone]   [facility] [place], [place] [address] [phone]   [place]  [address], [place] [phone]   Planned Parenthood    [phone]   [facility]    [phone]) [phone]   Community Health and [facility]  [address], [place] Phone:  [phone], Fax:  [phone] Hours of Operation:  9 am - 6 pm, M-F.  Also accepts Medicaid/Medicare and self-pay.  [facility]  [address], [place] Phone: [phone](336) [phone], Fax: [phone](336) [phone]. Hours of Operation:  8:30 am - 5:30 pm, M-F.  Also accepts Medicaid and self-pay.  [facility] [place] [address], [place] Phone: [phone]) [phone]   [facility] [address], [place], [place] [phone], Ext. [phone] Mondays & Thursdays: 7-9 AM.  First 15 patients are seen on a first come, first serve basis.    Medicaid-accepting [facility] Providers:  Organization         Address  Phone   Notes  [facility] [address], [place] [phone]) [phone] Also accepts self-pay patients.  [facility] [address] [place], [place]  [phone](336) [phone]   [facility] [address], [place] [phone]) [phone]   [facility] [address] [place] [address], [place] [phone]) [phone]   Renaye RakersVeita Bland [address], [place]   [phone]) [phone] Only accepts [place] Access [place] patients after they have their name applied to their card.   Self-Pay (no insurance) in [facility]:  Organization         Address  Phone   Notes  Sickle Cell Patients, [facility] [address], [place] [phone]) [phone]   [facility] [address], [place] [phone]) [phone]   [facility]  [address], [place] [phone]   [facility]/Dr. Osei-Bonsu  [address] [place] [address], [place] or [phone], [place] [phone] Phone number for both [place] and [place] locations is the same.  [facility] [address], [place] [phone]   [facility] [place] [address] [place] [address], [place] or [address] [phone]) [phone]   [facility] [address], [place] [phone](336) [phone], phone; [phone](336) [phone], fax Sees patients 1st and 3rd Saturday of every month.  Must not qualify for public or private insurance (i.e. Medicaid, Medicare, [place] Health Choice, Veterans' Benefits)  Household income should be no more than 200% of the poverty level The clinic cannot treat you if you are pregnant or think you are pregnant  Sexually transmitted diseases are not treated at the clinic.    Dental Care: Organization         Address  Phone  Notes  [facility] of [facility] [address], [place] [phone] Accepts children up to age 36 who are enrolled in [place] or [place] Health Choice; pregnant women with a Medicaid card; and children who have applied for Medicaid or [place] Health Choice, but were declined, whose parents can pay a reduced fee at time of service.  [facility] Department of [facility]  [place]  [address], [place] [phone]) [phone] Accepts children up to age 36 who are enrolled in [place] or [place] Health Choice; pregnant women with a Medicaid card; and children who have applied for Medicaid  or [place] Health Choice, but were declined, whose parents can pay a reduced fee at time of service.  [facility] Adult [facility] Access PROGRAM  [place] [phone] Patients are seen by appointment only. Walk-ins are not accepted. [place] will see patients 54 years of age and older. Monday - Tuesday (8am-5pm) Most Wednesdays (8:30-5pm) $30 per visit, cash only  [facility] Adult Dental Access PROGRAM  [address], [facility] [phone] Patients are seen by appointment only. Walk-ins are not accepted. [place] will see patients 7 years of age and older. One Wednesday Evening (Monthly: Volunteer Based).  $30 per visit, cash only  [place]  [phone] for adults; Children under age 62, call Graduate Pediatric Dentistry at [phone]. Children aged 74-14, please call [phone] to request a pediatric application.  Dental services are provided in all areas of dental care including fillings, crowns and bridges, complete and partial dentures, implants, gum treatment, root canals, and extractions. Preventive care is also provided. Treatment is provided to both adults and children. Patients are selected via a lottery and there is often a waiting list.   [facility] [address] [place]  [phone] [email or web address]   [facility] [address] [place], [place] [phone], Ext. [phone] Second and Fourth Thursday of each month, opens at 6:30 AM; Clinic ends at 9 AM.  Patients are seen on a first-come first-served basis, and a limited number are seen during each clinic.   [facility]  [address] Hillard Danker [place], [place] [phone]   Eligibility Requirements You must  have lived in [place], [place], or [place] counties for at least the last three months.   You cannot be eligible for state or federal sponsored [employer], including [employer], [place], or [employer].   You generally cannot be eligible for healthcare insurance through your employer.    How to apply: Eligibility screenings are held every Tuesday and Wednesday afternoon from 1:00 pm until 4:00 pm. You do not need an appointment for the interview!  [facility] [address] [place], [place]   [place]  [place] Department  [place]  [phone]    Behavioral Health Resources in the Community: Intensive Outpatient Programs Organization         Address  Phone  Notes  [place]. [address] [place], [place] [phone]   [facility] [address], [place], [place]   ADS: Alcohol & Drug Svcs [address] [place], [place]   [place] [address],  [place], [place] or [phone]   Substance Abuse Resources Organization         Address  Phone  Notes  Alcohol and Drug Services  [phone]   [place]  [phone]   [facility] [place]   Chinita Pester  [phone]   Residential & Outpatient Substance Abuse Program  [phone]   Psychological Services Organization         Address  Phone  Notes  [facility] [place]  [place]  [phone]   [place] [address] [place] or [phone]    Mobile Crisis Teams Organization         Address  Phone  Notes  [facility], Mobile Crisis Care Unit  [phone]   Assertive Psychotherapeutic Services  [address] [place], [place]   [facility] [address], [place] [phone]    Self-Help/Support Groups Organization         Address  Phone             Notes  Mental Health Assoc. of [place] - variety of support groups  [place] Call for more information  Narcotics Anonymous (NA), [facility] [address], [employer] [place]  2 meetings at this location   Special educational needs teacher         Address  Phone  Notes  [facility]idential [facility] [place],    [place]  [phone]   [facility]  [address], [place] [address]53, [place], [place]   [place], [place] [phone] Admissions: 8am-3pm M-F  Incentives Substance [place] [address]    [place], [place] [phone]   [facility] [address] [place], [place], [place]   [facility] [address],  [place], [place]   [facility] Programs - Intensive Outpatient [address], [place], [place]   [facility] [place].,  [place], [place] [phone] or [phone]   [facility] Services (RTS) [address], [place], [place] Accepts Medicaid  [facility] [place] [address],  [place] [phone] Substance Abuse/Addiction Treatment   [facility] Organization         Address  Phone  Notes  [facility]  [phone]   Domenic Schwab, PhD [address] Arlis [address]a [place], [place]   [phone] or [phone]   [place], [place] [phone]   [facility] [address] [place], [place] [phone] Insurance/Medicaid/sponsorship through [facility] and Families [address], [address] [place]                                    [place], [place] [phone] [place] [address], [place] [phone]    Dr. Adele Schilder  [phone]   [facility] Dept. 1) [address] [place] 2) [place] 3)  [address], [place] [phone]  [phone]   [place] [phone] or [phone] (After Hours)

## 2015-05-24 NOTE — ED Notes (Signed)
[place] MD at bedside. 

## 2015-05-24 NOTE — ED Notes (Signed)
MD at bedside. 

## 2015-05-25 ENCOUNTER — Telehealth (HOSPITAL_BASED_OUTPATIENT_CLINIC_OR_DEPARTMENT_OTHER): Payer: Self-pay | Admitting: Emergency Medicine

## 2015-05-25 LAB — B. BURGDORFI ANTIBODIES

## 2015-05-26 LAB — CULTURE, GROUP A STREP: Strep A Culture: NEGATIVE

## 2015-05-26 LAB — ROCKY MTN SPOTTED FVR ABS PNL(IGG+IGM)
RMSF IGG: POSITIVE — AB
RMSF IgM: 1.31 index — ABNORMAL HIGH (ref 0.00–0.89)

## 2015-05-26 LAB — RMSF, IGG, IFA: RMSF, IGG, IFA: 1:128 {titer} — ABNORMAL HIGH

## 2015-09-30 ENCOUNTER — Other Ambulatory Visit: Payer: Self-pay | Admitting: Medical

## 2015-09-30 DIAGNOSIS — R109 Unspecified abdominal pain: Secondary | ICD-10-CM

## 2015-10-05 ENCOUNTER — Ambulatory Visit
Admission: RE | Admit: 2015-10-05 | Discharge: 2015-10-05 | Disposition: A | Discharge status: Home / Self Care | Payer: BLUE CROSS/BLUE SHIELD | Source: Ambulatory Visit | Attending: Medical | Admitting: Medical

## 2015-10-05 DIAGNOSIS — R1011 Right upper quadrant pain: Secondary | ICD-10-CM | POA: Diagnosis not present

## 2015-10-05 DIAGNOSIS — R109 Unspecified abdominal pain: Secondary | ICD-10-CM

## 2015-10-08 ENCOUNTER — Ambulatory Visit
Admission: RE | Admit: 2015-10-08 | Discharge: 2015-10-08 | Disposition: A | Discharge status: Home / Self Care | Payer: BLUE CROSS/BLUE SHIELD | Source: Ambulatory Visit | Attending: Medical | Admitting: Medical

## 2015-10-08 ENCOUNTER — Other Ambulatory Visit: Payer: Self-pay | Admitting: Medical

## 2015-10-08 DIAGNOSIS — R0781 Pleurodynia: Secondary | ICD-10-CM | POA: Diagnosis present

## 2016-12-18 ENCOUNTER — Ambulatory Visit: Payer: Self-pay | Admitting: Medical

## 2016-12-18 VITALS — BP 135/75 | HR 72 | Temp 96.9°F | Resp 16 | Ht 69.0 in | Wt 183.0 lb

## 2016-12-18 DIAGNOSIS — T63461A Toxic effect of venom of wasps, accidental (unintentional), initial encounter: Secondary | ICD-10-CM

## 2016-12-18 DIAGNOSIS — Z026 Encounter for examination for insurance purposes: Secondary | ICD-10-CM

## 2016-12-18 NOTE — Progress Notes (Signed)
   Subjective:    Patient ID: Darral DashJames Dustin Besse, male    DOB: 03/29/1981, 36 y.o.   MRN: [id number]  HPI 36 yo stung by wasp while looking at a building. No prior history of allergic reaction.  Initially after the sting burning down toward the front teeth, with in 2 minutes that resolved and then felt upperlip inside with swelling. And Nasal congestion right side and runny nose. Ice given to patient in waiting room due to feeling swelling of upper lip feeling.    Review of Systems  Constitutional: Negative for chills and fever.  HENT: Positive for congestion. Negative for ear pain, postnasal drip and sore throat.   Eyes: Negative for discharge and itching.  Respiratory: Negative for cough and shortness of breath.   Cardiovascular: Negative for chest pain.  Gastrointestinal: Negative for abdominal pain.  Genitourinary: Negative for dysuria.  Musculoskeletal: Negative for myalgias.  Skin: Positive for color change.  Allergic/Immunologic: Positive for environmental allergies and food allergies.  Neurological: Negative for dizziness, syncope, light-headedness and headaches.  Hematological: Negative for adenopathy.  Psychiatric/Behavioral: Negative for agitation, behavioral problems and suicidal ideas. The patient is not nervous/anxious.    Eyes tearing after being stung.  Has taken Claritin without sedation .     Objective:   Physical Exam  Constitutional: He is oriented to person, place, and time. He appears well-developed and well-nourished.  HENT:  Head: Normocephalic and atraumatic.  Right Ear: External ear normal.  Left Ear: External ear normal.  Eyes: Pupils are equal, round, and reactive to light. Conjunctivae and EOM are normal.  Neck: Normal range of motion. Neck supple.  Cardiovascular: Normal rate, regular rhythm and normal heart sounds.  Exam reveals no gallop and no friction rub.   No murmur heard. Pulmonary/Chest: Effort normal and breath sounds normal.   Lymphadenopathy:    He has no cervical adenopathy.  Neurological: He is alert and oriented to person, place, and time.  Skin: Skin is warm and dry. There is erythema.  Psychiatric: He has a normal mood and affect. His behavior is normal. Judgment and thought content normal.  Nursing note and vitals reviewed.   Nose with erythema, and mild external swelling,  turbinate swelling  R> L  With clear discharge R> L.Marland Kitchen. Upper lip with edema noted on the inside of the lip blister like midline , able to talk and handle secretions without difficulty. No eye tearing noted while in clinic. .      Assessment & Plan:   Wasp sting  Ice to the area . On and off  Every 10 minutes. Given Loratadine 10 mg lot 5399 Exp 07/2017 and Ibuprofen 800 mg  Lot [id number] exp 09/2018 In the clinic.Did have lunch today. Return to the clinic in morning  for a recheck. May take Benadryl  25 mg prior to bed time.  Patient verbalizes understanding and has no questions at discharge.  .Marland Kitchen

## 2016-12-18 NOTE — Patient Instructions (Signed)
Ice to the nose area.     Bee, Wasp, or [employer], Adult Bees, wasps, and hornets are part of a family of insects that can sting people. These stings can cause pain and inflammation, but they are usually not serious. However, some people may have an allergic reaction to a sting. This can cause the symptoms to be more severe. What increases the risk? You may be at a greater risk of getting stung if you:  Provoke a stinging insect by swatting or disturbing it.  Wear strong-smelling soaps, deodorants, or body sprays.  Spend time outdoors near gardens with flowers or fruit trees or in clothes that expose skin.  Eat or drink outside.  What are the signs or symptoms? Common symptoms of this condition include:  A red lump in the skin that sometimes has a tiny hole in the center. In some cases, a stinger may be in the center of the wound.  Pain and itching at the sting site.  Redness and swelling around the sting site. If you have an allergic reaction (localized allergic reaction), the swelling and redness may spread out from the sting site. In some cases, this reaction can continue to develop over the next 24-48 hours.  In rare cases, a person may have a severe allergic reaction (anaphylactic reaction) to a sting. Symptoms of an anaphylactic reaction may include:  Wheezing or difficulty breathing.  Raised, itchy, red patches on the skin (hives).  Nausea or vomiting.  Abdominal cramping.  Diarrhea.  Tightness in the chest or chest pain.  Dizziness or fainting.  Redness of the face (flushing).  Hoarse voice.  Swollen tongue, lips, or face.  How is this diagnosed? This condition is usually diagnosed based on your symptoms and medical history as well as a physical exam. You may have an allergy test to determine if you are allergic to the substance that the insect injected during the sting (venom). How is this treated? If you were stung by a bee, the stinger and a small sac  of venom may be in the wound. It is important to remove the stinger as soon as possible. You can do this by brushing across the wound with gauze, a fingernail, or a flat card such as a credit card. Removing the stinger can help reduce the severity of your body's reaction to the sting. Most stings can be treated with:  Icing to reduce swelling in the area.  Medicines (antihistamines) to treat itching or an allergic reaction.  Medicines to help reduce pain. These may be medicines that you take by mouth, or medicated creams or lotions that you apply to your skin.  Pay close attention to your symptoms after you have been stung. If possible, have someone stay with you to make sure you do not have an allergic reaction. If you have any signs of an allergic reaction, call your health care provider. If you have ever had a severe allergic reaction, your health care provider may give you an inhaler or injectable medicine (epinephrine auto-injector) to use if necessary. Follow these instructions at home:  Wash the sting site 2-3 times each day with soap and water as told by your health care provider.  Apply or take over-the-counter and prescription medicines only as told by your health care provider.  If directed, apply ice to the sting area. ? Put ice in a plastic bag. ? Place a towel between your skin and the bag. ? Leave the ice on for 20 minutes, 2-3  times a day.  Do not scratch the sting area.  If you had a severe allergic reaction to a sting, you may need: ? To wear a medical bracelet or necklace that lists the allergy. ? To learn when and how to use an anaphylaxis kit or epinephrine injection. Your family members and coworkers may also need to learn this. ? To carry an anaphylaxis kit or epinephrine injection with you at all times. How is this prevented?  Avoid swatting at stinging insects and disturbing insect nests.  Do not use fragrant soaps or lotions.  Wear shoes, pants, and long  sleeves when spending time outdoors, especially in grassy areas where stinging insects are common.  Keep outdoor areas free from nests or hives.  Keep food and drink containers covered when eating outdoors.  Avoid working or sitting near [employer], if possible.  Wear gloves if you are gardening or working outdoors.  If an attack by a stinging insect or a swarm seems likely in the moment, move away from the area or find a barrier between you and the insect(s), such as a door. Contact a health care provider if:  Your symptoms do not get better in 2-3 days.  You have redness, swelling, or pain that spreads beyond the area of the sting.  You have a fever. Get help right away if: You have symptoms of a severe allergic reaction. These include:  Wheezing or difficulty breathing.  Tightness in the chest or chest pain.  Light-headedness or fainting.  Itchy, raised, red patches on the skin.  Nausea or vomiting.  Abdominal cramping.  Diarrhea.  A swollen tongue or lips, or trouble swallowing.  Dizziness or fainting.  Summary  Stings from bees, wasps, and hornets can cause pain and inflammation, but they are usually not serious. However, some people may have an allergic reaction to a sting. This can cause the symptoms to be more severe.  Pay close attention to your symptoms after you have been stung. If possible, have someone stay with you to make sure you do not have an allergic reaction.  Call your health care provider if you have any signs of an allergic reaction. This information is not intended to replace advice given to you by your health care provider. Make sure you discuss any questions you have with your health care provider. Document Released: 05/08/2005 Document Revised: 07/13/2016 Document Reviewed: 07/13/2016 Elsevier Interactive Patient Education  [employer].

## 2016-12-19 ENCOUNTER — Encounter: Payer: Self-pay | Admitting: Medical

## 2016-12-19 ENCOUNTER — Ambulatory Visit: Payer: Self-pay | Admitting: Medical

## 2016-12-19 VITALS — BP 115/65 | HR 67 | Temp 98.1°F | Resp 16

## 2016-12-19 DIAGNOSIS — Z026 Encounter for examination for insurance purposes: Secondary | ICD-10-CM

## 2016-12-19 DIAGNOSIS — T63461D Toxic effect of venom of wasps, accidental (unintentional), subsequent encounter: Secondary | ICD-10-CM

## 2016-12-19 NOTE — Patient Instructions (Signed)
Continue ice on/off every  10 min.  May take OTC antihistimine , non-sedative like  Claritn or Zyrtec. OTC ibuprofen  upt o 416m every 6 hours as needed for pain.    Bee, Wasp, or [employer] Adult Bees, wasps, and hornets are part of a family of insects that can sting people. These stings can cause pain and inflammation, but they are usually not serious. However, some people may have an allergic reaction to a sting. This can cause the symptoms to be more severe. What increases the risk? You may be at a greater risk of getting stung if you:  Provoke a stinging insect by swatting or disturbing it.  Wear strong-smelling soaps, deodorants, or body sprays.  Spend time outdoors near gardens with flowers or fruit trees or in clothes that expose skin.  Eat or drink outside.  What are the signs or symptoms? Common symptoms of this condition include:  A red lump in the skin that sometimes has a tiny hole in the center. In some cases, a stinger may be in the center of the wound.  Pain and itching at the sting site.  Redness and swelling around the sting site. If you have an allergic reaction (localized allergic reaction), the swelling and redness may spread out from the sting site. In some cases, this reaction can continue to develop over the next 24-48 hours.  In rare cases, a person may have a severe allergic reaction (anaphylactic reaction) to a sting. Symptoms of an anaphylactic reaction may include:  Wheezing or difficulty breathing.  Raised, itchy, red patches on the skin (hives).  Nausea or vomiting.  Abdominal cramping.  Diarrhea.  Tightness in the chest or chest pain.  Dizziness or fainting.  Redness of the face (flushing).  Hoarse voice.  Swollen tongue, lips, or face.  How is this diagnosed? This condition is usually diagnosed based on your symptoms and medical history as well as a physical exam. You may have an allergy test to determine if you are allergic to  the substance that the insect injected during the sting (venom). How is this treated? If you were stung by a bee, the stinger and a small sac of venom may be in the wound. It is important to remove the stinger as soon as possible. You can do this by brushing across the wound with gauze, a fingernail, or a flat card such as a credit card. Removing the stinger can help reduce the severity of your body's reaction to the sting. Most stings can be treated with:  Icing to reduce swelling in the area.  Medicines (antihistamines) to treat itching or an allergic reaction.  Medicines to help reduce pain. These may be medicines that you take by mouth, or medicated creams or lotions that you apply to your skin.  Pay close attention to your symptoms after you have been stung. If possible, have someone stay with you to make sure you do not have an allergic reaction. If you have any signs of an allergic reaction, call your health care provider. If you have ever had a severe allergic reaction, your health care provider may give you an inhaler or injectable medicine (epinephrine auto-injector) to use if necessary. Follow these instructions at home:  Wash the sting site 2-3 times each day with soap and water as told by your health care provider.  Apply or take over-the-counter and prescription medicines only as told by your health care provider.  If directed, apply ice to the sting area. ?  Put ice in a plastic bag. ? Place a towel between your skin and the bag. ? Leave the ice on for 20 minutes, 2-3 times a day.  Do not scratch the sting area.  If you had a severe allergic reaction to a sting, you may need: ? To wear a medical bracelet or necklace that lists the allergy. ? To learn when and how to use an anaphylaxis kit or epinephrine injection. Your family members and coworkers may also need to learn this. ? To carry an anaphylaxis kit or epinephrine injection with you at all times. How is this  prevented?  Avoid swatting at stinging insects and disturbing insect nests.  Do not use fragrant soaps or lotions.  Wear shoes, pants, and long sleeves when spending time outdoors, especially in grassy areas where stinging insects are common.  Keep outdoor areas free from nests or hives.  Keep food and drink containers covered when eating outdoors.  Avoid working or sitting near [employer], if possible.  Wear gloves if you are gardening or working outdoors.  If an attack by a stinging insect or a swarm seems likely in the moment, move away from the area or find a barrier between you and the insect(s), such as a door. Contact a health care provider if:  Your symptoms do not get better in 2-3 days.  You have redness, swelling, or pain that spreads beyond the area of the sting.  You have a fever. Get help right away if: You have symptoms of a severe allergic reaction. These include:  Wheezing or difficulty breathing.  Tightness in the chest or chest pain.  Light-headedness or fainting.  Itchy, raised, red patches on the skin.  Nausea or vomiting.  Abdominal cramping.  Diarrhea.  A swollen tongue or lips, or trouble swallowing.  Dizziness or fainting.  Summary  Stings from bees, wasps, and hornets can cause pain and inflammation, but they are usually not serious. However, some people may have an allergic reaction to a sting. This can cause the symptoms to be more severe.  Pay close attention to your symptoms after you have been stung. If possible, have someone stay with you to make sure you do not have an allergic reaction.  Call your health care provider if you have any signs of an allergic reaction. This information is not intended to replace advice given to you by your health care provider. Make sure you discuss any questions you have with your health care provider. Document Released: 05/08/2005 Document Revised: 07/13/2016 Document Reviewed:  07/13/2016 Elsevier Interactive Patient Education  [employer].

## 2016-12-19 NOTE — Progress Notes (Signed)
   Subjective:    Patient ID: Christopher Chan, male    DOB: 07/04/1980, 36 y.o.   MRN: [id number]  HPI  36 yo male returns for follow up workers compensation from wasp sting to nose. Feels better today, mild swelling , no redness. Still some upper lip swelling. Felt so good yesterday did not the Benadryl.  Review of Systems  Constitutional: Negative.   HENT: Positive for congestion and sinus pressure. Negative for ear pain, postnasal drip and sore throat.   Eyes: Negative.   Respiratory: Negative.   Cardiovascular: Negative.   Gastrointestinal: Negative.   Endocrine: Negative.   Genitourinary: Negative.   Musculoskeletal: Negative.   Skin: Positive for color change. Negative for pallor, rash and wound.  Allergic/Immunologic: Positive for environmental allergies. Negative for food allergies and immunocompromised state.  Neurological: Positive for speech difficulty.  Hematological: Negative.   Psychiatric/Behavioral: Negative.    Sinus pressure with raining , it is raining today. Trouble speaking due to swelling in lip.    Objective:   Physical Exam  Constitutional: He is oriented to person, place, and time. He appears well-developed and well-nourished.  HENT:  Head: Normocephalic and atraumatic.  Nose: Mucosal edema present.  Eyes: Pupils are equal, round, and reactive to light. Conjunctivae and EOM are normal.  Neck: Normal range of motion. Neck supple.  Lymphadenopathy:    He has cervical adenopathy.  Neurological: He is alert and oriented to person, place, and time.  Skin: Skin is warm.  Psychiatric: He has a normal mood and affect. His behavior is normal. Judgment and thought content normal.  Nursing note and vitals reviewed.    Mild swelling,  Turbinate swelling bilateral with R> L improved from yesterday Blister inside upper lip midline improved since yesterday.     Assessment & Plan:  Wasp sting I mproved. . Continue ice on/off every  10 min to the nose  as needed.  May take OTC non-sedative antihistimine like Claritin or Zyrtec, take as directed. OTC Ibuprofen every 6 hours  400 mg as needed for pain and swelling. One week follow up return sooner if any concerns.

## 2016-12-26 ENCOUNTER — Ambulatory Visit: Payer: Self-pay | Admitting: Medical

## 2017-01-23 ENCOUNTER — Ambulatory Visit: Payer: Self-pay | Admitting: Medical

## 2017-04-23 ENCOUNTER — Ambulatory Visit: Payer: Self-pay | Admitting: Medical

## 2017-04-25 ENCOUNTER — Encounter: Payer: Self-pay | Admitting: Medical

## 2017-04-25 ENCOUNTER — Ambulatory Visit: Payer: Self-pay | Admitting: Medical

## 2017-04-25 VITALS — BP 108/62 | HR 73 | Temp 97.0°F | Resp 18 | Wt 184.8 lb

## 2017-04-25 DIAGNOSIS — B351 Tinea unguium: Secondary | ICD-10-CM

## 2017-04-25 NOTE — Progress Notes (Signed)
   Subjective:    Patient ID: Christopher Chan, male    DOB: 11/20/1980, 36 y.o.   MRN: [id number]  HPI 36 yo male in non acute distress comes in today with complaints of over last  2 years, sweating feet more on cold days , and this keeps his feet cold. Tried different socks with no relief ( cotton , wool, thick and thin, moisture wickign, none seem to help). Complains of toenail fungus under the great toes of each feet since Feb 2018. Mar[place] Kitchen. Has tried gold bond helps some.He wears boots, tennis shoes , and flip flops (sweating occurs in theses as well).  Feet continue to sweat even when shoes and sock are off. He uses warm water to get them to stop sweating.    Review of Systems  Constitutional: Negative for chills and fever.  HENT: Positive for congestion (on / off he says it is resolving on its own) and ear pain (right ear pain on /off no pain now resolved 1.5 weeks ago). Negative for sore throat.   Eyes: Negative for discharge and itching.  Respiratory: Negative for cough and shortness of breath.   Cardiovascular: Negative for chest pain and leg swelling.  Gastrointestinal: Negative for abdominal pain.  Endocrine: Negative for polydipsia, polyphagia and polyuria.  Genitourinary: Negative for dysuria.  Musculoskeletal: Negative for myalgias.  Skin: Negative for rash.  Allergic/Immunologic: Positive for environmental allergies and food allergies.  Neurological: Negative for dizziness, syncope and light-headedness.  Hematological: Negative for adenopathy.  Psychiatric/Behavioral: Negative for behavioral problems, self-injury and suicidal ideas. The patient is not nervous/anxious.        Objective:   Physical Exam  Constitutional: He is oriented to person, place, and time. He appears well-developed and well-nourished.  HENT:  Head: Normocephalic and atraumatic.  Eyes: Conjunctivae and EOM are normal. Pupils are equal, round, and reactive to light.  Neurological: He is alert and  oriented to person, place, and time.  Skin: Skin is warm. No rash noted. No erythema. No pallor.  Psychiatric: He has a normal mood and affect. His behavior is normal. Judgment and thought content normal.  Nursing note and vitals reviewed.    Feet are clean, no odor, no sign of athletes feet.do feel damp , he is wearing cotton socks today and tennis shoes. Toenail fungus noted on Great toes bilaterally with R>L 2+ PT/ DP bilaterally.    Assessment & Plan:  Hyperhidrosis of feet Onychomycosis of great toenails bilaterally. Try OTC  foot antiperspirant take as directed.  Referral to Podiatry for further evaluation. Tip sheet given to patient on foot care.  Patient verbalizes understanding and has no questions at discharge.

## 2017-04-25 NOTE — Patient Instructions (Signed)
Fungal Nail Infection Fungal nail infection is a common fungal infection of the toenails or fingernails. This condition affects toenails more often than fingernails. More than one nail may be infected. The condition can be passed from person to person (is contagious). What are the causes? This condition is caused by a fungus. Several types of funguses can cause the infection. These funguses are common in moist and warm areas. If your hands or feet come into contact with the fungus, it may get into a crack in your fingernail or toenail and cause the infection. What increases the risk? The following factors may make you more likely to develop this condition:  Being male.  Having diabetes.  Being of older age.  Living with someone who has the fungus.  Walking barefoot in areas where the fungus thrives, such as showers or locker rooms.  Having poor circulation.  Wearing shoes and socks that cause your feet to sweat.  Having athlete's foot.  Having a nail injury or history of a recent nail surgery.  Having psoriasis.  Having a weak body defense system (immune system).  What are the signs or symptoms? Symptoms of this condition include:  A pale spot on the nail.  Thickening of the nail.  A nail that becomes yellow or brown.  A brittle or ragged nail edge.  A crumbling nail.  A nail that has lifted away from the nail bed.  How is this diagnosed? This condition is diagnosed with a physical exam. Your health care provider may take a scraping or clipping from your nail to test for the fungus. How is this treated? Mild infections do not need treatment. If you have significant nail changes, treatment may include:  Oral antifungal medicines. You may need to take the medicine for several weeks or several months, and you may not see the results for a long time. These medicines can cause side effects. Ask your health care provider what problems to watch for.  Antifungal nail polish  and nail cream. These may be used along with oral antifungal medicines.  Laser treatment of the nail.  Surgery to remove the nail. This may be needed for the most severe infections.  Treatment takes a long time, and the infection may come back. Follow these instructions at home: Medicines  Take or apply over-the-counter and prescription medicines only as told by your health care provider.  Ask your health care provider about using over-the-counter mentholated ointment on your nails. Lifestyle   Do not share personal items, such as towels or nail clippers.  Trim your nails often.  Wash and dry your hands and feet every day.  Wear absorbent socks, and change your socks frequently.  Wear shoes that allow air to circulate, such as sandals or canvas tennis shoes. Throw out old shoes.  Wear rubber gloves if you are working with your hands in wet areas.  Do not walk barefoot in shower rooms or locker rooms.  Do not use a nail salon that does not use clean instruments.  Do not use artificial nails. General instructions  Keep all follow-up visits as told by your health care provider. This is important.  Use antifungal foot powder on your feet and in your shoes. Contact a health care provider if: Your infection is not getting better or it is getting worse after several months. This information is not intended to replace advice given to you by your health care provider. Make sure you discuss any questions you have with your health   care provider. Document Released: 05/05/2000 Document Revised: 10/14/2015 Document Reviewed: 11/09/2014 Elsevier Interactive Patient Education  2018 [employer] Hyperhidrosis It is normal to sweat when you are hot, being physically active, or feeling anxious. Sweating is a necessary function for your body. However, hyperhidrosis is when you sweat too much (excessively). Although hyperhidrosis is not dangerous, it can make you feel embarrassed. There are  two kinds of hyperhidrosis:  Primary hyperhidrosis. The sweating usually localizes in one part of your body, such as your underarms, or in a few areas, such as your feet, face, armpits, and hands. This is the more common kind of hyperhidrosis.  Secondary hyperhidrosis. This type more likely affects your entire body.  What are the causes? The cause of your hyperhidrosis depends on the kind you have.  Primary hyperhidrosis may be caused by having sweat glands that are more active than normal.  Secondary hyperhidrosis is caused by an underlying condition. Possible conditions include: ? Diabetes. ? Gout. ? Certain medicines. ? Anxiety. ? Stroke. ? Obesity. ? Menopause. ? Overactive thyroid (hyperthyroidism). ? Tumors. ? Frostbite. ? Certain types of cancers. ? Alcoholism. ? Injury to your nervous system. ? Stroke. ? Parkinson disease.  What increases the risk? You may be at an increased risk for primary hyperhidrosis if you have a family history of it. What are the signs or symptoms? General symptoms of hyperhidrosis may include:  Feeling like you are sweating constantly, even while you are resting.  Having skin that peels or gets paler or softer in the areas where you sweat the most.  Being able to see sweat on your skin.  Symptoms of primary hyperhidrosis may include:  Sweating in specific areas, such as your armpits, palms, feet, and face.  Sweating in the same location on both sides of your body.  Sweating only during the day.  Symptoms of secondary hyperhidrosis may include:  Sweating all over your body.  Sweating even while you sleep.  How is this diagnosed? Hyperhidrosis may be diagnosed by:  Medical history and physical exam.  Testing, such as: ? Sweat test. ? Paper test.  How is this treated? Your treatment will depend on the kind of hyperhidrosis you have and the parts of your body that are affected. If your hyperhidrosis is caused by an underlying  condition, your treatment will address the cause. Treatment may include:  Strong antiperspirants. Your health care provider may give you a prescription.  Medicines taken by mouth.  Medicines injected by your health care provider. These may include small amounts of botulinum toxin.  Iontophoresis. This is a procedure that temporarily turns off the sweat glands in your hands and feet.  Surgery to remove your sweat glands.  Sympathectomy. This is a procedure that cuts or destroys your nerves so that they do not send a signal to sweat.  Follow these instructions at home:  Take medicines only as directed by your health care provider.  Use antiperspirants as directed by your health care provider.  Limit or avoid foods or beverages that seem to increase your chances of sweating, such as: ? Spicy food. ? Caffeine. ? Alcohol. ? Foods that contain MSG.  If your feet sweat: ? Wear sandals, when possible. ? Do not wear cotton socks. Wear socks that remove or wick moisture from your feet. ? Wear leather shoes. ? Avoid wearing the same pair of shoes two days in a row.  Consider joining a hyperhidrosis support group. Contact a health care provider if:  You have  new symptoms.  Your symptoms get worse. This information is not intended to replace advice given to you by your health care provider. Make sure you discuss any questions you have with your health care provider. Document Released: 07/07/2005 Document Revised: 10/14/2015 Document Reviewed: 12/16/2013 Elsevier Interactive Patient Education  2018 [employer] Foot Care, Adult Foot care is an important part of your health. Noticing and addressing any potential problems early is the best way to prevent future foot problems. How to care for your feet Foot Hygiene  Wash your feet daily with warm water and mild soap. Do not use hot water. Then, pat your feet and the areas between your toes until they are completely dry. Do not soak  your feet as this can dry your skin.  Trim your toenails straight across. Do not dig under them or around the cuticle. File the edges of your nails with an emery board or nail file.  On the skin on your feet and on dry, brittle nails, apply a moisturizing lotion or petroleum jelly that is unscented and does not contain alcohol. Do not apply lotion between your toes. Shoes and Socks   Wear clean socks or stockings every day. Make sure they are not too tight.  Wear shoes that fit properly and have enough cushioning. To break in new shoes, wear them for just a few hours a day. This prevents you from injuring your feet. Always look in your shoes before you put them on to be sure there are no objects inside. Wounds, Scrapes, Corns, and Calluses  Check your feet daily for blisters, cuts, and redness. If you cannot see the bottom of your feet, use a mirror or ask someone for help.  Do not cut corns or calluses. Do not try to remove them with medicine.  If you find a minor scrape, cut, or break in the skin on your feet, keep it and the skin around it clean and dry. These areas may be cleaned with mild soap and water. Do not clean the area with peroxide, alcohol, or iodine.  If you have a wound, scrape, corn, or callus on your foot, look at it several times a day to make sure it is healing and is not infected. Check for: ? More redness, swelling, or pain. ? More fluid or blood. ? Warmth. ? Pus or a bad smell. General Instructions  Do not cross your legs. That may decrease the blood flow to your feet.  Do not use heating pads or hot water bottles on your feet. They may burn your skin. If you have lost feeling in your feet or legs, you may not know it is happening until it is too late.  Make sure your health care provider does a complete foot exam at least annually or more often if you have foot problems. If you have foot problems, report any cuts, sores, or bruises to your health care provider  immediately. Contact a health care provider if:  You have a medical condition that increases your risk of infection and you have any cuts, sores, or bruises on your feet.  You have an injury that is not healing.  You notice redness on your legs or feet.  You feel burning or tingling in your legs or feet.  You have pain or cramps in your legs or feet.  Your legs or feet are numb.  Your feet always feel cold.  You have pain around a toenail. Get help right away if:  You  have a wound, scrape, corn, or callus on your foot and: ? You have more redness, swelling, or pain. ? You have more fluid or blood. ? Your wound, scrape, corn, or callus feels warm to the touch. ? You have pus or a bad smell coming from the wound, scrape, corn, or callus. ? You have a fever.  You have a red line going up your leg. This information is not intended to replace advice given to you by your health care provider. Make sure you discuss any questions you have with your health care provider. Document Released: 10/15/2015 Document Revised: 12/28/2015 Document Reviewed: 10/15/2015 Elsevier Interactive Patient Education  [employer].

## 2017-05-08 ENCOUNTER — Encounter: Payer: BLUE CROSS/BLUE SHIELD | Admitting: Podiatry

## 2017-05-09 IMAGING — CR DG CHEST 2V
1 series · 2 of 2 positions shown · non-contrast
Comparison: None.

CLINICAL DATA: Chronic right lower rib and chest pain several
years. Smoker.

EXAM:
CHEST  2 VIEW

[Series 1: dg chest 2 view · 0.14mm/px · 2 of 2 slices shown]
[im 1/2]
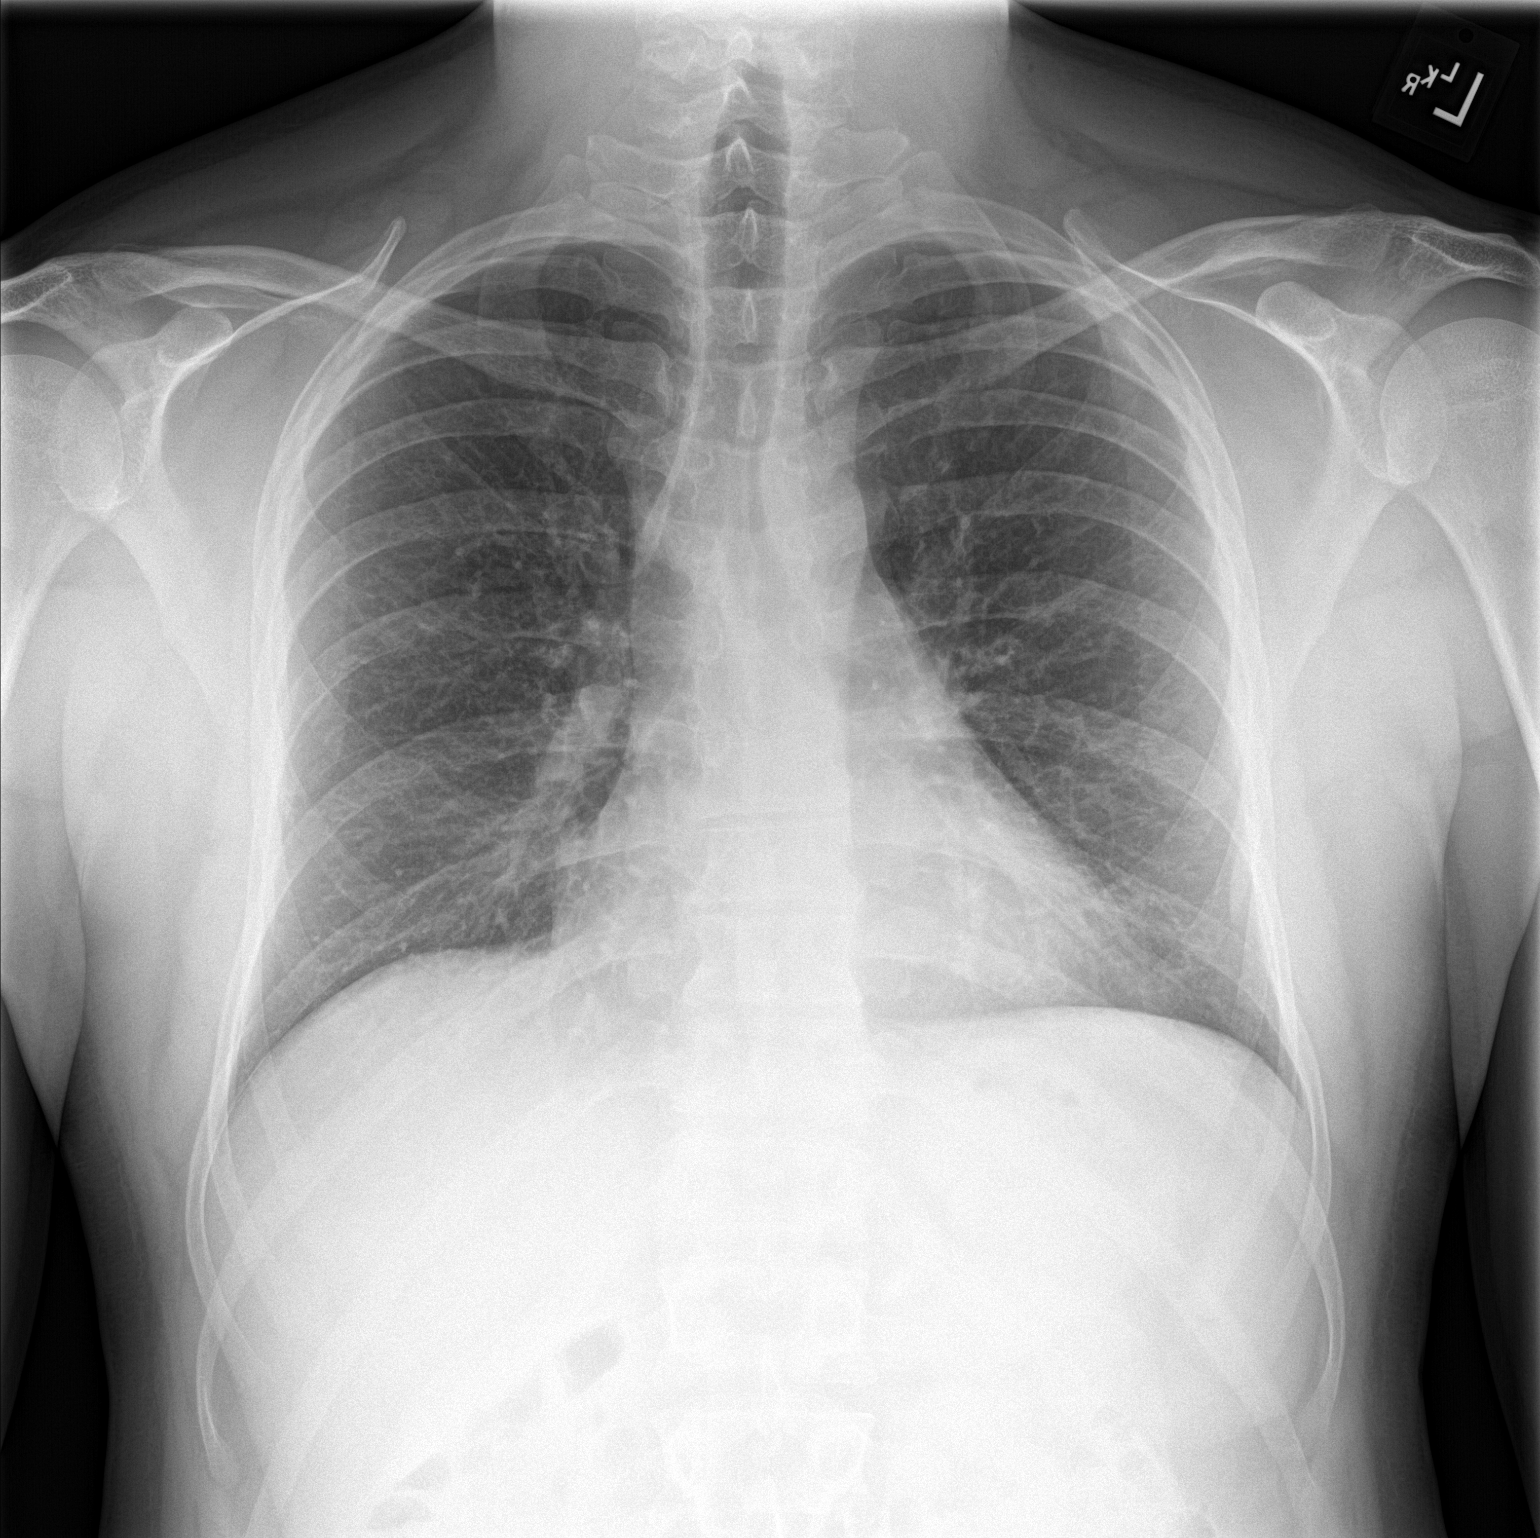
[im 2/2]
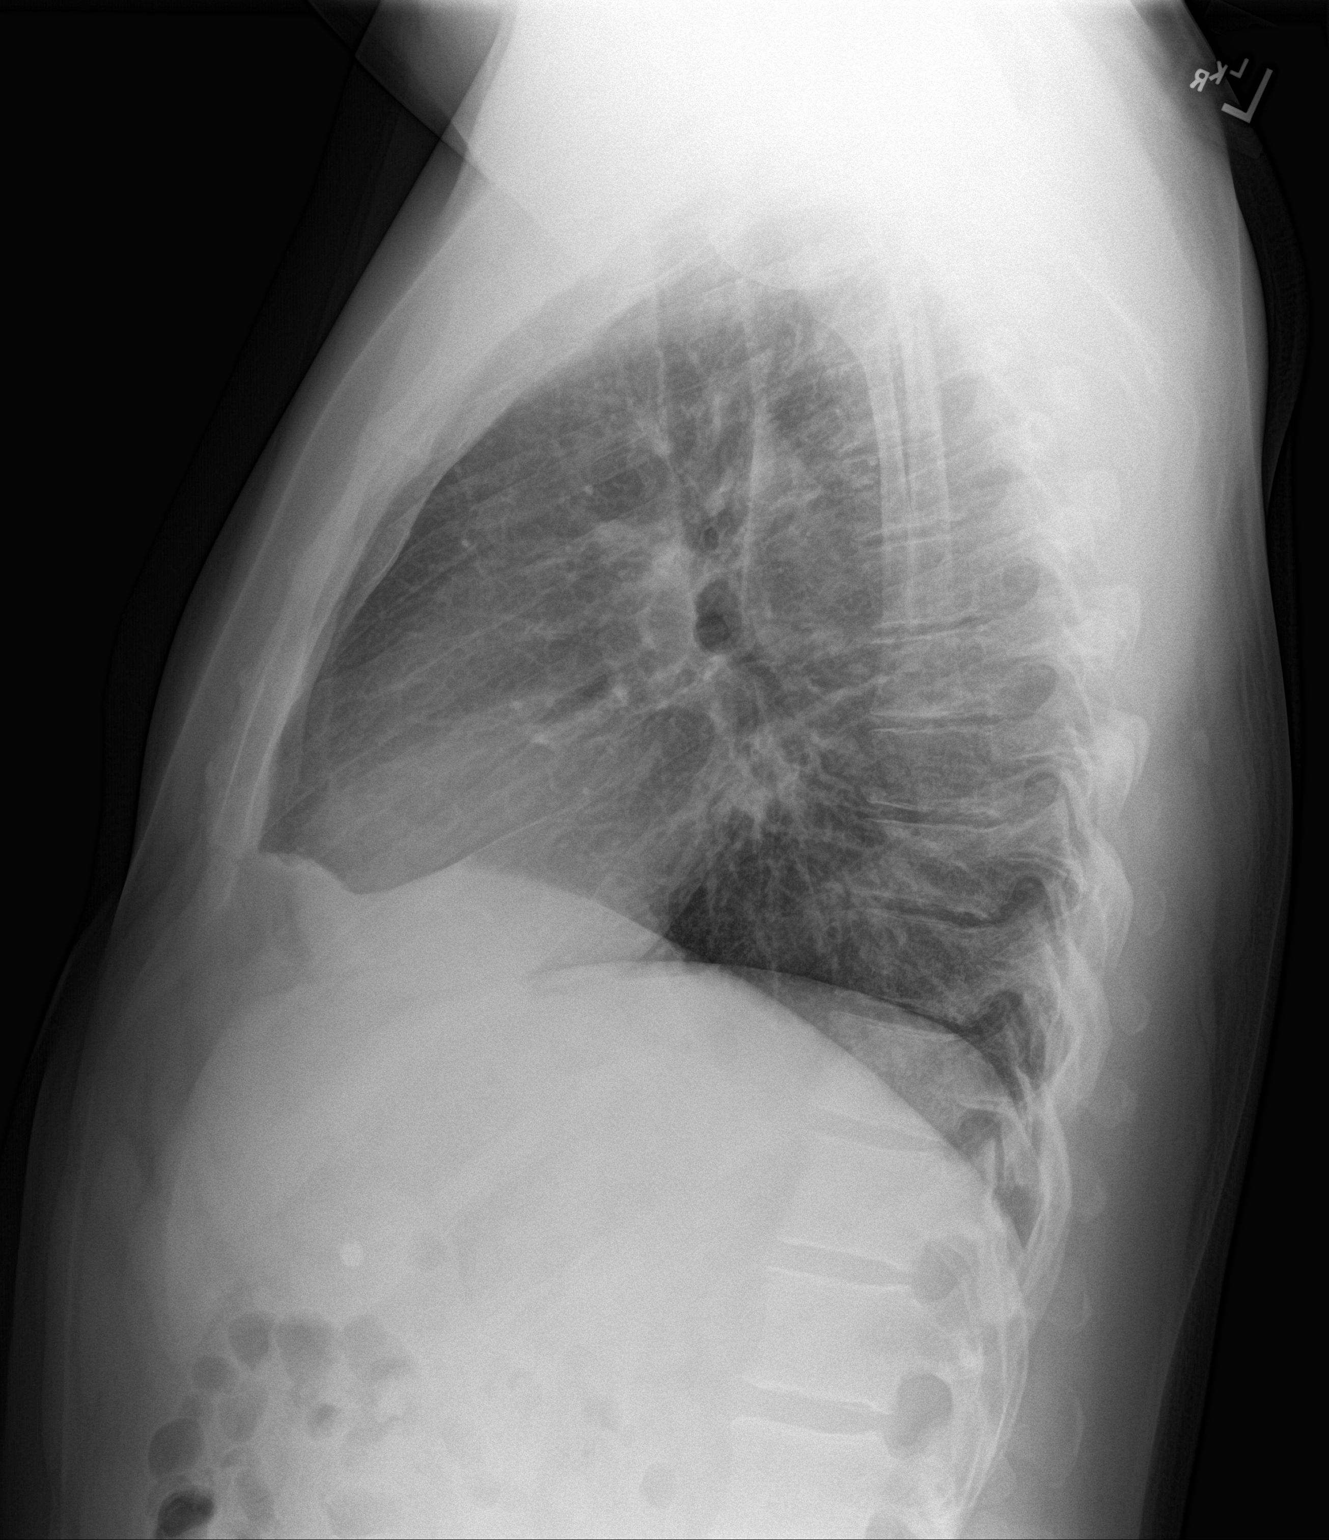

[2 of 2 positions shown; findings below may reference images not displayed]

FINDINGS: The heart size and mediastinal contours are within normal limits.
Both lungs are clear. No evidence of pneumothorax or hemothorax. The
visualized skeletal structures are unremarkable.
IMPRESSION: No active cardiopulmonary disease.

## 2017-05-12 NOTE — Progress Notes (Signed)
This encounter was created in error - please disregard.

## 2017-06-04 ENCOUNTER — Telehealth: Payer: Self-pay

## 2017-06-04 NOTE — Telephone Encounter (Signed)
Called pt to follow up regarding his referral to Dr. Al CorpusHyatt, Podiatry from 04/25/17; pt states he received a letter in the mail that he had an appt on 05/02/18, yes 2019; he forgot to follow up with them but will call them to set up appt and ask about date on letter

## 2018-07-04 ENCOUNTER — Ambulatory Visit: Payer: BLUE CROSS/BLUE SHIELD | Admitting: Nurse Practitioner

## 2018-07-05 ENCOUNTER — Emergency Department
Admission: EM | Admit: 2018-07-05 | Discharge: 2018-07-05 | Disposition: A | Discharge status: Home / Self Care | Payer: BLUE CROSS/BLUE SHIELD | Attending: Emergency Medicine | Admitting: Emergency Medicine

## 2018-07-05 ENCOUNTER — Emergency Department: Payer: BLUE CROSS/BLUE SHIELD

## 2018-07-05 ENCOUNTER — Encounter: Payer: Self-pay | Admitting: Nurse Practitioner

## 2018-07-05 ENCOUNTER — Encounter: Payer: Self-pay | Admitting: Emergency Medicine

## 2018-07-05 ENCOUNTER — Other Ambulatory Visit: Payer: Self-pay

## 2018-07-05 DIAGNOSIS — Y9389 Activity, other specified: Secondary | ICD-10-CM | POA: Insufficient documentation

## 2018-07-05 DIAGNOSIS — Y990 Civilian activity done for income or pay: Secondary | ICD-10-CM | POA: Insufficient documentation

## 2018-07-05 DIAGNOSIS — S5291XA Unspecified fracture of right forearm, initial encounter for closed fracture: Secondary | ICD-10-CM

## 2018-07-05 DIAGNOSIS — Y9289 Other specified places as the place of occurrence of the external cause: Secondary | ICD-10-CM | POA: Insufficient documentation

## 2018-07-05 DIAGNOSIS — F17210 Nicotine dependence, cigarettes, uncomplicated: Secondary | ICD-10-CM | POA: Diagnosis not present

## 2018-07-05 DIAGNOSIS — S59911A Unspecified injury of right forearm, initial encounter: Secondary | ICD-10-CM | POA: Diagnosis present

## 2018-07-05 DIAGNOSIS — W228XXA Striking against or struck by other objects, initial encounter: Secondary | ICD-10-CM | POA: Insufficient documentation

## 2018-07-05 MED ORDER — IBUPROFEN 600 MG PO TABS: 600.0000 mg | ORAL_TABLET | Freq: Three times a day (TID) | ORAL | 0 refills | Status: DC | PRN

## 2018-07-05 MED ORDER — TRAMADOL HCL 50 MG PO TABS: 50.0000 mg | ORAL_TABLET | Freq: Four times a day (QID) | ORAL | 0 refills | Status: DC | PRN

## 2018-07-05 MED ORDER — TRAMADOL HCL 50 MG PO TABS
50.0000 mg | ORAL_TABLET | Freq: Once | ORAL | Status: AC
  Administered 2018-07-05: 50 mg via ORAL
  Filled 2018-07-05: qty 1

## 2018-07-05 MED ORDER — NAPROXEN 500 MG PO TABS
500.0000 mg | ORAL_TABLET | Freq: Once | ORAL | Status: AC
  Administered 2018-07-05: 500 mg via ORAL
  Filled 2018-07-05: qty 1

## 2018-07-05 NOTE — Discharge Instructions (Signed)
Wears splint and sling until evaluation by orthopedics.  Call today to schedule appointment.

## 2018-07-05 NOTE — ED Notes (Signed)
See triage note.  Presents with bilateral forearm pain  States he was trying to move a chair  It became stuck in door  ..states he pushed down with both arms  Small bruised area noted to left forearm  Min swelling and increased tenderness noted at right forearm   No deformity noted  Good pulses

## 2018-07-05 NOTE — ED Triage Notes (Signed)
Pt in via [facility] from work.  Pt reports injuring right forearm while moving a chair at work.  Arm in sling upon arrival, no obvious deformity noted.  Pt is an [employer], does wish to [employer].  Vitals WDL.

## 2018-07-05 NOTE — Progress Notes (Deleted)
Provider was called to waiting room with reports of an employee about to "pass out". Christopher Chan was found sitting in the chair with a dusky appearance and

## 2018-07-05 NOTE — Progress Notes (Addendum)
Provider was called to the waiting room with with reports of an employee about "to pass out" around 10:40. Christopher Chan was found sitting in the chair with a dusky appearance and his eyes were closed but responsive. 911 was called by front office staff.  In no acute respiratory distress and holding his right arm over his body at a 90 angle. What information could be obtained by office manager RN went to grab some smelling salt was he was trying to get a chair through a door frame and his arms got caught. He reports he felt a pop and immediate pain to right forearm area which writer is unable to visualize d/t sweater. His blood pressure taken to left arm sitting revealed 88/60 x 2.During conversation to keep patient aroused while waiting for EMS patient reports when he is in severe pain his blood pressure drops.  Ice was also applied to forehead while waiting on EMS to arrive. Once the patient started to arouse more and more verbal with eyes open, skin returned to normal pigmentation and radial pulse remained 2+ throughout writer left patient in waiting room with RN to attend to needs while EMS arrived and informed he was transported to ED in stable condition around 11:15 am. We attempted to contact girlfriend per patient request and approval with no answer and unable to leave voicemail.

## 2018-07-05 NOTE — ED Provider Notes (Signed)
[facility] Emergency Department Provider Note   ____________________________________________   First MD Initiated Contact with Patient 07/05/18 1203     (approximate)  I have reviewed the triage vital signs and the nursing notes.   HISTORY  Chief Complaint Arm Injury    HPI Corky Ledezma is a 38 y.o. male patient presents with bilateral forearm pain secondary to contusion.  Patient states he attempted to push a chair through a door by striking with his bilateral forearms.  Patient state right forearm hurts more than the left.  Patient denies loss sensation or loss of function.  Patient rates his pain as a 7/10.  Patient described the pain is "achy".  No palliative measures prior to arrival.    Past Medical History:  Diagnosis Date  . Acid reflux   . Hematuria - cause not known     Patient Active Problem List   Diagnosis Date Noted  . Urticaria 08/02/2011    Past Surgical History:  Procedure Laterality Date  . HERNIA REPAIR      Prior to Admission medications   Medication Sig Start Date End Date Taking? Authorizing Provider  diphenhydrAMINE (BENADRYL) 25 MG tablet Take 50 mg by mouth every 4 (four) hours as needed for itching.    [provider]  EPINEPHrine 0.3 mg/0.3 mL IJ SOAJ injection Inject 0.3 mg into the muscle once.    [provider]  ibuprofen (ADVIL,MOTRIN) 600 MG tablet Take 1 tablet (600 mg total) by mouth every 8 (eight) hours as needed. 07/05/18   Joni Reining, PA-C  omeprazole (PRILOSEC) 20 MG capsule Take 20 mg by mouth daily.    [provider]  traMADol (ULTRAM) 50 MG tablet Take 1 tablet (50 mg total) by mouth every 6 (six) hours as needed. 07/05/18   Joni Reining, PA-C    Allergies Other; Amoxicillin; and Prednisone  No family history on file.  Social History Social History   Tobacco Use  . Smoking status: Current Every Day Smoker    Packs/day: 1.00  . Smokeless tobacco:  Never Used  Substance Use Topics  . Alcohol use: Yes    Comment: occasional  . Drug use: No    Review of Systems Constitutional: No fever/chills Eyes: No visual changes. ENT: No sore throat. Cardiovascular: Denies chest pain. Respiratory: Denies shortness of breath. Gastrointestinal: No abdominal pain.  No nausea, no vomiting.  No diarrhea.  No constipation. Genitourinary: Negative for dysuria. Musculoskeletal: Bilateral forearm pain. Skin: Negative for rash. Neurological: Negative for headaches, focal weakness or numbness. Allergic/Immunilogical: Amoxicillin and prednisone. ____________________________________________   PHYSICAL EXAM:  VITAL SIGNS: ED Triage Vitals  Enc Vitals Group     BP 07/05/18 1152 116/73     Pulse Rate 07/05/18 1152 (!) 58     Resp 07/05/18 1152 16     Temp 07/05/18 1152 98 F (36.7 C)     Temp Source 07/05/18 1152 Oral     SpO2 07/05/18 1152 97 %     Weight 07/05/18 1153 175 lb (79.4 kg)     Height 07/05/18 1153 5\' 9"  (1.753 m)     Head Circumference --      Peak Flow --      Pain Score 07/05/18 1152 7     Pain Loc --      Pain Edu? --      Excl. in GC? --     Constitutional: Alert and oriented. Well appearing and in no acute distress.  Cardiovascular: Normal rate, regular rhythm. Grossly normal heart sounds.  Good peripheral circulation. Respiratory: Normal respiratory effort.  No retractions. Lungs CTAB. Musculoskeletal: No obvious deformity to the bilateral forearm.  Patient has moderate guarding palpation of the distal ulna of the right forearm.  Neurologic:  Normal speech and language. No gross focal neurologic deficits are appreciated. No gait instability. Skin:  Skin is warm, dry and intact. No rash noted.  Ecchymosis left forearm. Psychiatric: Mood and affect are normal. Speech and behavior are normal.  ____________________________________________   LABS (all labs ordered are listed, but only abnormal results are displayed)  Labs  Reviewed - No data to display ____________________________________________  EKG   ____________________________________________  RADIOLOGY  ED MD interpretation:    Official radiology report(s): Dg Forearm Left  Result Date: 07/05/2018 CLINICAL DATA:  Bilateral forearm pain after injury moving furniture. EXAM: LEFT FOREARM - 2 VIEW COMPARISON:  None. FINDINGS: There is no evidence of fracture or other focal bone lesions. Soft tissues are unremarkable. IMPRESSION: Negative. Electronically Signed   By: Lupita Raider, M.D.   On: 07/05/2018 12:49   Dg Forearm Right  Result Date: 07/05/2018 CLINICAL DATA:  Bilateral arm pain after injury moving furniture. EXAM: RIGHT FOREARM - 2 VIEW COMPARISON:  None. FINDINGS: Nondisplaced cortical fracture is seen involving the distal right ulna. Radius is unremarkable. No soft tissue abnormality is noted. IMPRESSION: Nondisplaced distal right ulnar shaft fracture. Electronically Signed   By: Lupita Raider, M.D.   On: 07/05/2018 12:48    ____________________________________________   PROCEDURES  Procedure(s) performed:   .Splint Application Date/Time: 07/05/2018 12:56 PM Performed by: Jacqlyn Larsen, NT Authorized by: Joni Reining, PA-C   Consent:    Consent obtained:  Verbal   Consent given by:  Parent   Risks discussed:  Numbness, pain and swelling Pre-procedure details:    Sensation:  Normal Procedure details:    Laterality:  Right   Location:  Arm   Arm:  R lower arm   Strapping: no     Splint type:  Volar short arm   Supplies:  Ortho-Glass Post-procedure details:    Pain:  Unchanged   Sensation:  Normal   Patient tolerance of procedure:  Tolerated well, no immediate complications    Critical Care performed:   ____________________________________________   INITIAL IMPRESSION / ASSESSMENT AND PLAN / ED COURSE  As part of my medical decision making, I reviewed the following data within the electronic MEDICAL RECORD NUMBER      Patient presents with bilateral forearm pain.  X-rays revealed a right nondisplaced cortical fracture of the distal ulna.  Negative x-ray findings of the left forearm.  Patient placed in a ulnar splint and sling.  Patient given discharge care instruction advised take medication as directed.  Patient to follow-up with orthopedic by calling for an appointment.      ____________________________________________   FINAL CLINICAL IMPRESSION(S) / ED DIAGNOSES  Final diagnoses:  Closed fracture of right forearm, initial encounter     ED Discharge Orders         Ordered    traMADol (ULTRAM) 50 MG tablet  Every 6 hours PRN     07/05/18 1258    ibuprofen (ADVIL,MOTRIN) 600 MG tablet  Every 8 hours PRN     07/05/18 1258           Note:  This document was prepared using Dragon voice recognition software and may include unintentional dictation errors.    Joni Reining,  PA-C 07/05/18 1304    Schaevitz, Myra Rudeavid Matthew, MD 07/05/18 [phone]

## 2019-05-01 ENCOUNTER — Ambulatory Visit: Payer: BLUE CROSS/BLUE SHIELD | Admitting: Nurse Practitioner

## 2019-05-01 ENCOUNTER — Encounter: Payer: Self-pay | Admitting: Nurse Practitioner

## 2019-05-01 ENCOUNTER — Other Ambulatory Visit: Payer: Self-pay

## 2019-05-01 VITALS — BP 112/71 | HR 52 | Temp 98.5°F | Resp 16 | Ht 69.0 in | Wt 169.0 lb

## 2019-05-01 DIAGNOSIS — R109 Unspecified abdominal pain: Secondary | ICD-10-CM

## 2019-05-01 NOTE — Patient Instructions (Addendum)
Let's evaluate some labs then, you and I will decide if the next steps will be to see GI for further evaluation Keep yourself hydrated I do encourage you to establish with a primary care provider and you can call [phone] for assistance with this Encouraged patient to call the office or primary care doctor for an appointment if no improvement in symptoms or if symptoms change or worsen after 72 hours of planned treatment. Patient verbalized understanding of all instructions given/reviewed and has no further questions or concerns at this time.

## 2019-05-01 NOTE — Progress Notes (Signed)
   Subjective:    Patient ID: Christopher Chan, male    DOB: 13-Apr-1981, 38 y.o.   MRN: [id number]  HPI Christopher Chan is here today with c/o abdominal pain to his upper region 11/29. He denies any flare up and has GERD which he reports is well controlled and takes OTC Omeprazole for the last "3-4 years". He does report tomato sauce can agitate his GERD symptoms. He's reports that he has abdominal pain that feels like a "wrench" after he eats and after he uses the bathroom that last for about 15-30 minutes which he feels has worsened. Rates this pain 6/7 and hyperactive bowel sounds. He has eaten soup over the last few days which he feels has helped. Reports occasional nausea with no vomiting. He has diarrhea yesterday and now his stool is normal. He denies blood in his stool and reports usually has normal BM 1-2 times a day. He's tolerating fluids. Denies routine ETOH use and last time was July 2020, denies current use of NSAIDS.  He reports right inguinal surgery about 17 years ago FH: mother diverticulitis, breast CA    Review of Systems  Constitutional: Negative for fatigue and fever.  Gastrointestinal: Positive for abdominal pain, diarrhea and nausea. Negative for abdominal distention, constipation and vomiting.         Objective:   Physical Exam Constitutional:      General: He is not in acute distress.    Appearance: He is well-developed. He is not ill-appearing.  HENT:     Head: Normocephalic and atraumatic.  Cardiovascular:     Rate and Rhythm: Normal rate.     Heart sounds: Normal heart sounds.  Pulmonary:     Effort: Pulmonary effort is normal.     Breath sounds: Normal breath sounds.  Abdominal:     General: Bowel sounds are decreased.     Tenderness: There is abdominal tenderness. There is no guarding. Positive signs include Rovsing's sign. Negative signs include Murphy's sign and McBurney's sign.     Hernia: No hernia is present.     Comments: Abdominal exam is  unremarkable except BS hypoactive. No discoloration of skin or tenderness on exam  Skin:    General: Skin is warm and dry.  Neurological:     General: No focal deficit present.     Mental Status: He is alert and oriented to person, place, and time.  Psychiatric:        Mood and Affect: Mood normal.           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 Will await labs

## 2019-05-02 ENCOUNTER — Telehealth: Payer: Self-pay | Admitting: Nurse Practitioner

## 2019-05-02 NOTE — Telephone Encounter (Signed)
Called Christopher Chan to informed about results although h. Pylori pending. Unable to get patient on the phone. Will follow up Monday to review pending labs and try to contact pt. Patient appears to be getting over a virus and shows signs of slight dehydration. Would want him to start probiotics twice daily to stabilize his gut and to increase water intake at least 8 cups and additionally drink gatorade. Will try to relay this information to patient next week because he has not set up mychart.

## 2019-05-02 NOTE — Telephone Encounter (Signed)
Spoke with Christopher Chan and informed him about his labs and that h. Pylori is pending. He reports that he's off today and feels so much better. He reports that he's been trying to hydrate. I encouraged him to consider drinking gatorade, drink at least 8 cups of water a day and start a probiotic for about 3-5 days to try to stabilize his gut flora. I informed him that I would call him when his remaining labs came in and it appears he had some gastroenteritis that is resolving.

## 2019-05-03 LAB — CMP14+EGFR
ALT: 38 IU/L (ref 0–44)
AST: 24 IU/L (ref 0–40)
Albumin/Globulin Ratio: 2.4 — ABNORMAL HIGH (ref 1.2–2.2)
Albumin: 4.7 g/dL (ref 4.0–5.0)
Alkaline Phosphatase: 79 IU/L (ref 39–117)
BUN/Creatinine Ratio: 8 — ABNORMAL LOW (ref 9–20)
BUN: 8 mg/dL (ref 6–20)
Bilirubin Total: 0.4 mg/dL (ref 0.0–1.2)
CO2: 24 mmol/L (ref 20–29)
Calcium: 9.8 mg/dL (ref 8.7–10.2)
Chloride: 103 mmol/L (ref 96–106)
Creatinine, Ser: 1 mg/dL (ref 0.76–1.27)
GFR calc Af Amer: 109 mL/min/{1.73_m2} (ref >59)
GFR calc non Af Amer: 94 mL/min/{1.73_m2} (ref >59)
Globulin, Total: 2 g/dL (ref 1.5–4.5)
Glucose: 113 mg/dL — ABNORMAL HIGH (ref 65–99)
Potassium: 4.3 mmol/L (ref 3.5–5.2)
Sodium: 141 mmol/L (ref 134–144)
Total Protein: 6.7 g/dL (ref 6.0–8.5)

## 2019-05-03 LAB — CBC WITH DIFFERENTIAL/PLATELET
Basophils Absolute: 0.1 10*3/uL (ref 0.0–0.2)
Basos: 1 %
EOS (ABSOLUTE): 0.9 10*3/uL — ABNORMAL HIGH (ref 0.0–0.4)
Eos: 10 %
Hematocrit: 42.8 % (ref 37.5–51.0)
Hemoglobin: 15 g/dL (ref 13.0–17.7)
Immature Grans (Abs): 0 10*3/uL (ref 0.0–0.1)
Immature Granulocytes: 0 %
Lymphocytes Absolute: 2.1 10*3/uL (ref 0.7–3.1)
Lymphs: 22 %
MCH: 31.1 pg (ref 26.6–33.0)
MCHC: 35 g/dL (ref 31.5–35.7)
MCV: 89 fL (ref 79–97)
Monocytes Absolute: 0.6 10*3/uL (ref 0.1–0.9)
Monocytes: 6 %
Neutrophils Absolute: 5.7 10*3/uL (ref 1.4–7.0)
Neutrophils: 61 %
Platelets: 304 10*3/uL (ref 150–450)
RBC: 4.83 x10E6/uL (ref 4.14–5.80)
RDW: 12.1 % (ref 11.6–15.4)
WBC: 9.4 10*3/uL (ref 3.4–10.8)

## 2019-05-03 LAB — LIPASE: Lipase: 22 U/L (ref 13–78)

## 2019-05-03 LAB — H PYLORI, IGM, IGG, IGA AB
H pylori, IgM Abs: <9 units (ref 0.0–8.9)
H. pylori, IgA Abs: <9 units (ref 0.0–8.9)
H. pylori, IgG AbS: 0.24 Index Value (ref 0.00–0.79)

## 2019-05-03 LAB — AMYLASE: Amylase: 38 U/L (ref 31–110)

## 2019-05-07 ENCOUNTER — Telehealth: Payer: Self-pay | Admitting: *Deleted

## 2019-05-07 NOTE — Telephone Encounter (Signed)
Jeneen Rinks called [employer] [facility] [employer] asking about additional lab results. Advised Mauricio that Maury Dus NP would be in touch with him tomorrow about his results as discussed with him on Friday and in her note. Further advised there was nothing urgent or that required treatment that came up in his labs however, Brayton Layman would call him tomorrow to discuss lab results. Quamere verbalizes understanding and expresses gratitude for return phone call and has no further needs or concerns.

## 2019-05-08 ENCOUNTER — Telehealth: Payer: Self-pay

## 2019-05-08 NOTE — Telephone Encounter (Signed)
Notified patient that his H Pylori test was negative.  Patient is feeling much better but still having some pain on his left side which is probably due to strenuous  activity at work.  Patient is to call us if symptoms worsen or any questions/concerns.

## 2019-05-09 ENCOUNTER — Telehealth: Payer: Self-pay

## 2019-05-09 ENCOUNTER — Ambulatory Visit (INDEPENDENT_AMBULATORY_CARE_PROVIDER_SITE_OTHER): Payer: BC Managed Care – PPO

## 2019-05-09 ENCOUNTER — Other Ambulatory Visit: Payer: Self-pay

## 2019-05-09 ENCOUNTER — Ambulatory Visit
Admission: EM | Admit: 2019-05-09 | Discharge: 2019-05-09 | Disposition: A | Discharge status: Home / Self Care | Payer: BC Managed Care – PPO | Attending: Family Medicine | Admitting: Family Medicine

## 2019-05-09 ENCOUNTER — Encounter: Payer: Self-pay | Admitting: Emergency Medicine

## 2019-05-09 DIAGNOSIS — R1013 Epigastric pain: Secondary | ICD-10-CM

## 2019-05-09 DIAGNOSIS — R52 Pain, unspecified: Secondary | ICD-10-CM

## 2019-05-09 LAB — LIPASE, BLOOD: Lipase: 22 U/L (ref 11–51)

## 2019-05-09 LAB — COMPREHENSIVE METABOLIC PANEL
ALT: 38 U/L (ref 0–44)
AST: 25 U/L (ref 15–41)
Albumin: 4.5 g/dL (ref 3.5–5.0)
Alkaline Phosphatase: 71 U/L (ref 38–126)
Anion gap: 6 (ref 5–15)
BUN: 11 mg/dL (ref 6–20)
CO2: 27 mmol/L (ref 22–32)
Calcium: 9.3 mg/dL (ref 8.9–10.3)
Chloride: 103 mmol/L (ref 98–111)
Creatinine, Ser: 0.82 mg/dL (ref 0.61–1.24)
GFR calc Af Amer: >60 mL/min (ref >60)
GFR calc non Af Amer: >60 mL/min (ref >60)
Glucose, Bld: 96 mg/dL (ref 70–99)
Potassium: 4.2 mmol/L (ref 3.5–5.1)
Sodium: 136 mmol/L (ref 135–145)
Total Bilirubin: 0.4 mg/dL (ref 0.3–1.2)
Total Protein: 7.2 g/dL (ref 6.5–8.1)

## 2019-05-09 LAB — CBC WITH DIFFERENTIAL/PLATELET
Abs Immature Granulocytes: 0.04 10*3/uL (ref 0.00–0.07)
Basophils Absolute: 0.1 10*3/uL (ref 0.0–0.1)
Basophils Relative: 1 %
Eosinophils Absolute: 0.6 10*3/uL — ABNORMAL HIGH (ref 0.0–0.5)
Eosinophils Relative: 5 %
HCT: 41.7 % (ref 39.0–52.0)
Hemoglobin: 14.3 g/dL (ref 13.0–17.0)
Immature Granulocytes: 0 %
Lymphocytes Relative: 17 %
Lymphs Abs: 2.4 10*3/uL (ref 0.7–4.0)
MCH: 30.7 pg (ref 26.0–34.0)
MCHC: 34.3 g/dL (ref 30.0–36.0)
MCV: 89.5 fL (ref 80.0–100.0)
Monocytes Absolute: 0.6 10*3/uL (ref 0.1–1.0)
Monocytes Relative: 4 %
Neutro Abs: 9.9 10*3/uL — ABNORMAL HIGH (ref 1.7–7.7)
Neutrophils Relative %: 73 %
Platelets: 290 10*3/uL (ref 150–400)
RBC: 4.66 MIL/uL (ref 4.22–5.81)
RDW: 12.1 % (ref 11.5–15.5)
WBC: 13.6 10*3/uL — ABNORMAL HIGH (ref 4.0–10.5)
nRBC: 0 % (ref 0.0–0.2)

## 2019-05-09 MED ORDER — ONDANSETRON 8 MG PO TBDP: 8.0000 mg | ORAL_TABLET | Freq: Three times a day (TID) | ORAL | 0 refills | Status: DC | PRN

## 2019-05-09 NOTE — Discharge Instructions (Addendum)
Over the counter Prilosec (omeprazole) 20mg  daily Clear liquids then advance diet slowly as tolerated

## 2019-05-09 NOTE — ED Provider Notes (Signed)
[facility]    CSN: [id number] Arrival date & time: 05/09/19  1331      History   Chief Complaint Chief Complaint  Patient presents with  . Abdominal Pain    HPI Christopher Chan is a 38 y.o. male.   38 yo male with a c/o abdominal pains intermittently since thanksgiving. States pain is mainly on the right upper area and seems to be worse after eating. Denies any fevers, chills, diarrhea. Had one episode of vomiting 2 days ago.      Past Medical History:  Diagnosis Date  . Acid reflux   . Hematuria - cause not known     Patient Active Problem List   Diagnosis Date Noted  . Urticaria 08/02/2011    Past Surgical History:  Procedure Laterality Date  . HERNIA REPAIR         Home Medications    Prior to Admission medications   Medication Sig Start Date End Date Taking? Authorizing Provider  omeprazole (PRILOSEC) 20 MG capsule Take 20 mg by mouth daily.   Yes [provider]  diphenhydrAMINE (BENADRYL) 25 MG tablet Take 50 mg by mouth every 4 (four) hours as needed for itching.    [provider]  EPINEPHrine 0.3 mg/0.3 mL IJ SOAJ injection Inject 0.3 mg into the muscle once.    [provider]  ibuprofen (ADVIL,MOTRIN) 600 MG tablet Take 1 tablet (600 mg total) by mouth every 8 (eight) hours as needed. Patient not taking: Reported on 05/01/2019 07/05/18   Joni Reining, PA-C  ondansetron (ZOFRAN ODT) 8 MG disintegrating tablet Take 1 tablet (8 mg total) by mouth every 8 (eight) hours as needed. 05/09/19   [name], MD  traMADol (ULTRAM) 50 MG tablet Take 1 tablet (50 mg total) by mouth every 6 (six) hours as needed. Patient not taking: Reported on 05/01/2019 07/05/18   Joni Reining, PA-C    Family History Family History  Problem Relation Age of Onset  . Heart attack Mother        43s  . Breast cancer Mother   . Healthy Father     Social History Social History   Tobacco Use  . Smoking status: Current  Every Day Smoker    Packs/day: 1.00  . Smokeless tobacco: Never Used  Substance Use Topics  . Alcohol use: Yes    Comment: occasional  . Drug use: No     Allergies   Other, Amoxicillin, and Prednisone   Review of Systems Review of Systems   Physical Exam Triage Vital Signs ED Triage Vitals  Enc Vitals Group     BP 05/09/19 1357 108/80     Pulse Rate 05/09/19 1357 63     Resp 05/09/19 1357 18     Temp 05/09/19 1357 98.3 F (36.8 C)     Temp Source 05/09/19 1357 Oral     SpO2 05/09/19 1357 99 %     Weight 05/09/19 1358 169 lb (76.7 kg)     Height 05/09/19 1358 5\' 9"  (1.753 m)     Head Circumference --      Peak Flow --      Pain Score 05/09/19 1357 5     Pain Loc --      Pain Edu? --      Excl. in GC? --    No data found.  Updated Vital Signs BP 108/80 (BP Location: Left Arm)   Pulse 63   Temp 98.3 F (36.8  C) (Oral)   Resp 18   Ht 5\' 9"  (1.753 m)   Wt 76.7 kg   SpO2 99%   BMI 24.96 kg/m   Visual Acuity Right Eye Distance:   Left Eye Distance:   Bilateral Distance:    Right Eye Near:   Left Eye Near:    Bilateral Near:     Physical Exam Vitals and nursing note reviewed.  Constitutional:      General: He is not in acute distress.    Appearance: He is not toxic-appearing or diaphoretic.  Cardiovascular:     Rate and Rhythm: Normal rate.  Pulmonary:     Effort: Pulmonary effort is normal. No respiratory distress.     Breath sounds: Normal breath sounds.  Abdominal:     General: Bowel sounds are normal. There is no distension.     Palpations: Abdomen is soft. There is no mass.     Tenderness: There is abdominal tenderness (mild, epigastric and right upper quadrant). There is no right CVA tenderness, left CVA tenderness, guarding or rebound.     Hernia: No hernia is present.  Neurological:     Mental Status: He is alert.      UC Treatments / Results  Labs (all labs ordered are listed, but only abnormal results are displayed) Labs Reviewed    CBC WITH DIFFERENTIAL/PLATELET - Abnormal; Notable for the following components:      Result Value   WBC 13.6 (*)    Neutro Abs 9.9 (*)    Eosinophils Absolute 0.6 (*)    All other components within normal limits  COMPREHENSIVE METABOLIC PANEL  LIPASE, BLOOD    EKG   Radiology DG Abd 2 Views  Result Date: 05/09/2019 CLINICAL DATA:  Acute abdominal pain with nausea today. EXAM: ABDOMEN - 2 VIEW COMPARISON:  None. FINDINGS: The bowel gas pattern is normal. There is no evidence of free air. No radio-opaque calculi or other significant radiographic abnormality is seen. IMPRESSION: Negative. Electronically Signed   By: Margarette Canada M.D.   On: 05/09/2019 15:30   US Abdomen Limited RUQ  Result Date: 05/09/2019 CLINICAL DATA:  Upper abdominal pain EXAM: ULTRASOUND ABDOMEN LIMITED RIGHT UPPER QUADRANT COMPARISON:  None. FINDINGS: Gallbladder: No gallstones or wall thickening visualized. There is no pericholecystic fluid. No sonographic Murphy sign noted by sonographer. Common bile duct: Diameter: 3 mm. No intrahepatic or extrahepatic biliary duct dilatation. Liver: No focal lesion identified. Within normal limits in parenchymal echogenicity. Portal vein is patent on color Doppler imaging with normal direction of blood flow towards the liver. Other: None. IMPRESSION: Study within normal limits. Electronically Signed   By: Lowella Grip III M.D.   On: 05/09/2019 16:04    Procedures Procedures (including critical care time)  Medications Ordered in UC Medications - No data to display  Initial Impression / Assessment and Plan / UC Course  I have reviewed the triage vital signs and the nursing notes.  Pertinent labs & imaging results that were available during my care of the patient were reviewed by me and considered in my medical decision making (see chart for details).      Final Clinical Impressions(s) / UC Diagnoses   Final diagnoses:  Pain  Epigastric pain     Discharge  Instructions     Over the counter Prilosec (omeprazole) 20mg  daily Clear liquids then advance diet slowly as tolerated    ED Prescriptions    Medication Sig Dispense Auth. Provider   ondansetron (ZOFRAN ODT) 8 MG  disintegrating tablet Take 1 tablet (8 mg total) by mouth every 8 (eight) hours as needed. 6 tablet Payton Mccallumonty, Ernestyne Caldwell, MD      1. Labs/x-ray results and diagnosis reviewed with patient 2. rx as per orders above; reviewed possible side effects, interactions, risks and benefits  3. Recommend supportive treatment as above 4. Follow-up prn if symptoms worsen or don't improve   PDMP not reviewed this encounter.   Payton Mccallumonty, Hason Ofarrell, MD 05/09/19 [phone]

## 2019-05-09 NOTE — Telephone Encounter (Signed)
Patient called c/o intense abdominal pain today.  He was seen in our office last week and tested for H Pylori. The test results came back negative.  Patient states he had been feeling better but today he called c/o intense abdominal pain.  He was instructed to report to urgent care today for xrays to be done.

## 2019-05-09 NOTE — ED Triage Notes (Signed)
Patient in today c/o abdominal pain x today. Patient states that he had this after Thanksgiving and saw his wellness provider at work and they did lab work which was all normal. Patient states that the pain resolved and hasn't had pain x 1 week, but this morning the pain came back more intense.

## 2019-05-19 ENCOUNTER — Encounter: Payer: Self-pay | Admitting: Gastroenterology

## 2019-05-19 ENCOUNTER — Ambulatory Visit: Payer: BC Managed Care – PPO | Admitting: Gastroenterology

## 2019-05-19 ENCOUNTER — Other Ambulatory Visit: Payer: Self-pay

## 2019-05-19 VITALS — BP 103/66 | HR 60 | Temp 97.8°F | Ht 69.0 in | Wt 172.6 lb

## 2019-05-19 DIAGNOSIS — R1013 Epigastric pain: Secondary | ICD-10-CM | POA: Diagnosis not present

## 2019-05-19 MED ORDER — OMEPRAZOLE 40 MG PO CPDR: 40.0000 mg | DELAYED_RELEASE_CAPSULE | Freq: Every day | ORAL | 1 refills | Status: DC

## 2019-05-19 NOTE — Progress Notes (Signed)
Wyline Mood MD, MRCP(U.K) [address]  [address]  [place], [place] [address]  Main: [phone]  Fax: [phone]   Gastroenterology Consultation  Referring Provider: ED Primary Care Physician:  Patient, No Pcp Per Primary Gastroenterologist:  Dr. Wyline Mood  Reason for Consultation:     DIARRHEA        HPI:   Christopher Chan is a 38 y.o. y/o male presented to the emergency room on 05/09/2019 with abdominal pains ongoing since Thanksgiving.  It was at that time on the right upper part of his abdomen.  Had one episode of vomiting 2 days prior.  Ultrasound of the abdomen right upper quadrant was normal.  Abdominal x-ray was normal.  Lipase normal, white cell count of 13.6.  Hemoglobin of 14.3 g.  Normal LFTs and BMP.  He states that right after Thanksgiving he developed some abdominal discomfort in the epigastric area and in the right upper quadrant area.  Usually occurs about 20 minutes after eating and can last from 30 to 60 minutes.  Colicky in nature.  No clear aggravating factors.  Sometimes relieved by bowel movement.  Does not feel that his stool is particularly hard in nature.  No weight loss or other weight gain.  No NSAID use.  Not on any PPI.    Past Medical History:  Diagnosis Date  . Acid reflux   . Hematuria - cause not known     Past Surgical History:  Procedure Laterality Date  . HERNIA REPAIR      Prior to Admission medications   Medication Sig Start Date End Date Taking? Authorizing Provider  diphenhydrAMINE (BENADRYL) 25 MG tablet Take 50 mg by mouth every 4 (four) hours as needed for itching.    [provider]  EPINEPHrine 0.3 mg/0.3 mL IJ SOAJ injection Inject 0.3 mg into the muscle once.    [provider]  ibuprofen (ADVIL,MOTRIN) 600 MG tablet Take 1 tablet (600 mg total) by mouth every 8 (eight) hours as needed. Patient not taking: Reported on 05/01/2019 07/05/18   Joni Reining, PA-C  omeprazole (PRILOSEC) 20 MG  capsule Take 20 mg by mouth daily.    [provider]  ondansetron (ZOFRAN ODT) 8 MG disintegrating tablet Take 1 tablet (8 mg total) by mouth every 8 (eight) hours as needed. 05/09/19   [name], MD  traMADol (ULTRAM) 50 MG tablet Take 1 tablet (50 mg total) by mouth every 6 (six) hours as needed. Patient not taking: Reported on 05/01/2019 07/05/18   Joni Reining, PA-C    Family History  Problem Relation Age of Onset  . Heart attack Mother        40s  . Breast cancer Mother   . Healthy Father      Social History   Tobacco Use  . Smoking status: Current Every Day Smoker    Packs/day: 1.00  . Smokeless tobacco: Never Used  Substance Use Topics  . Alcohol use: Yes    Comment: occasional  . Drug use: No    Allergies as of 05/19/2019 - Review Complete 05/09/2019  Allergen Reaction Noted  . Other  12/18/2016  . Amoxicillin Hives and Rash 05/24/2015  . Prednisone Rash 05/22/2015    Review of Systems:    All systems reviewed and negative except where noted in HPI.   Physical Exam:  There were no vitals taken for this visit. No LMP for male patient. Psych:  Alert and cooperative. Normal mood and affect.  General:   Alert,  Well-developed, well-nourished, pleasant and cooperative in NAD Head:  Normocephalic and atraumatic. Eyes:  Sclera clear, no icterus.   Conjunctiva pink. Ears:  Normal auditory acuity. Abdomen:  Normal bowel sounds.  No bruits.  Soft, non-tender and non-distended without masses, hepatosplenomegaly or hernias noted.  No guarding or rebound tenderness.    Neurologic:  Alert and oriented x3;  grossly normal neurologically. Skin:  Intact without significant lesions or rashes. No jaundice. Psych:  Alert and cooperative. Normal mood and affect.  Imaging Studies: DG Abd 2 Views  Result Date: 05/09/2019 CLINICAL DATA:  Acute abdominal pain with nausea today. EXAM: ABDOMEN - 2 VIEW COMPARISON:  None. FINDINGS: The bowel gas pattern is normal.  There is no evidence of free air. No radio-opaque calculi or other significant radiographic abnormality is seen. IMPRESSION: Negative. Electronically Signed   By: Margarette Canada M.D.   On: 05/09/2019 15:30   US Abdomen Limited RUQ  Result Date: 05/09/2019 CLINICAL DATA:  Upper abdominal pain EXAM: ULTRASOUND ABDOMEN LIMITED RIGHT UPPER QUADRANT COMPARISON:  None. FINDINGS: Gallbladder: No gallstones or wall thickening visualized. There is no pericholecystic fluid. No sonographic Murphy sign noted by sonographer. Common bile duct: Diameter: 3 mm. No intrahepatic or extrahepatic biliary duct dilatation. Liver: No focal lesion identified. Within normal limits in parenchymal echogenicity. Portal vein is patent on color Doppler imaging with normal direction of blood flow towards the liver. Other: None. IMPRESSION: Study within normal limits. Electronically Signed   By: Lowella Grip III M.D.   On: 05/09/2019 16:04    Assessment and Plan:   Christopher Chan is a 38 y.o. y/o male has been referred for diarrhea but his main issue today is abdominal discomfort ongoing since Thanksgiving.  The history is suggestive of possible dyspepsia versus biliary colic versus irritable bowel with constipation.  I did mention to him that sometimes he can have a combination of them as well.   1.  Empirical trial of PPI of 40 mg Prilosec for 4 to 6 weeks. 2.  High-fiber diet patient information provided.  Fiber pills samples provided. 3.  H. pylori breath test 4.  Video visit in about 3 weeks time if no better then may consider HIDA scan and possibly endoscopy at that point.  Follow up in 3 to 4 weeks video visit  Dr Jonathon Bellows MD,MRCP(U.K)

## 2019-05-19 NOTE — Patient Instructions (Signed)

## 2019-05-20 LAB — H. PYLORI BREATH TEST: H pylori Breath Test: NEGATIVE

## 2019-05-21 ENCOUNTER — Encounter: Payer: Self-pay | Admitting: Gastroenterology

## 2019-06-11 NOTE — Progress Notes (Signed)
    No show for appointment time.

## 2019-06-12 ENCOUNTER — Ambulatory Visit (INDEPENDENT_AMBULATORY_CARE_PROVIDER_SITE_OTHER): Payer: BC Managed Care – PPO | Admitting: Adult Health

## 2019-06-12 DIAGNOSIS — Z5329 Procedure and treatment not carried out because of patient's decision for other reasons: Secondary | ICD-10-CM

## 2019-06-17 ENCOUNTER — Ambulatory Visit (INDEPENDENT_AMBULATORY_CARE_PROVIDER_SITE_OTHER): Payer: BC Managed Care – PPO | Admitting: Gastroenterology

## 2019-06-17 ENCOUNTER — Telehealth: Payer: Self-pay | Admitting: Gastroenterology

## 2019-06-17 DIAGNOSIS — R1013 Epigastric pain: Secondary | ICD-10-CM | POA: Diagnosis not present

## 2019-06-17 DIAGNOSIS — G8929 Other chronic pain: Secondary | ICD-10-CM

## 2019-06-17 NOTE — Telephone Encounter (Signed)
No vm to offer 3 month f/u apt [facility] with Dr. Tobi Bastos

## 2019-06-17 NOTE — Progress Notes (Signed)
Wyline Mood , MD [address]  [address]  [place], [place] [address]  Main: [phone]  Fax: [phone]   Primary Care Physician: [facility]  Virtual Visit via Video Note  I connected with patient on 06/17/19 at  2:00 PM EST by video and verified that I am speaking with the correct person using two identifiers.   I discussed the limitations, risks, security and privacy concerns of performing an evaluation and management service by video  and the availability of in person appointments. I also discussed with the patient that there may be a patient responsible charge related to this service. The patient expressed understanding and agreed to proceed.  Location of Patient: Home Location of Provider: Home Persons involved: Patient and provider only   History of Present Illness:   Follow-up for dyspepsia  HPI: Christopher Chan is a 39 y.o. male    Summary of history : He was initially referred and seen on 05/19/2019 for abdominal pains ongoing since Thanksgiving.  Initially began in the right upper part of his abdomen.  1 episodes of vomiting. Lipase normal, white cell count of 13.6.  Hemoglobin of 14.3 g.  Normal LFTs and BMP.  He stated that right after Thanksgiving he developed some abdominal discomfort in the epigastric area and in the right upper quadrant area.  Usually occurs about 20 minutes after eating and can last from 30 to 60 minutes.  Colicky in nature.  No clear aggravating factors.  Sometimes relieved by bowel movement.  Does not feel that his stool is particularly hard in nature.  No weight loss or other weight gain.  No NSAID use.  Not on any PPI.   Interval history 05/19/2019-06/17/2019  05/09/2019: Right upper quadrant ultrasound: Normal study: 05/19/2019: H. pylori breath test: Negative: Since he commenced the 40 mg of omeprazole he says that his abdominal pain and discomfort have almost resolved.  His bowels he says are sometimes firm and  sometimes very soft.  He has increased the amount of fluid content in his diet but not vegetables grains or nuts.  He takes fiber pills twice a day and if he increases the dose it causes gas and bloating.   Current Outpatient Medications  Medication Sig Dispense Refill  . diphenhydrAMINE (BENADRYL) 25 MG tablet Take 50 mg by mouth every 4 (four) hours as needed for itching.    Marland Kitchen EPINEPHrine 0.3 mg/0.3 mL IJ SOAJ injection Inject 0.3 mg into the muscle once.    Marland Kitchen ibuprofen (ADVIL,MOTRIN) 600 MG tablet Take 1 tablet (600 mg total) by mouth every 8 (eight) hours as needed. (Patient not taking: Reported on 05/01/2019) 15 tablet 0  . omeprazole (PRILOSEC) 40 MG capsule Take 1 capsule (40 mg total) by mouth daily. 90 capsule 1  . ondansetron (ZOFRAN ODT) 8 MG disintegrating tablet Take 1 tablet (8 mg total) by mouth every 8 (eight) hours as needed. 6 tablet 0  . traMADol (ULTRAM) 50 MG tablet Take 1 tablet (50 mg total) by mouth every 6 (six) hours as needed. (Patient not taking: Reported on 05/01/2019) 20 tablet 0   No current facility-administered medications for this visit.    Allergies as of 06/17/2019 - Review Complete 05/19/2019  Allergen Reaction Noted  . Other  12/18/2016  . Amoxicillin Hives and Rash 05/24/2015  . Prednisone Rash 05/22/2015    Review of Systems:    All systems reviewed and negative except where noted in HPI.  General Appearance:  Alert, cooperative, no distress, appears stated age  Head:    Normocephalic, without obvious abnormality, atraumatic  Eyes:    PERRL, conjunctiva/corneas clear,  Ears:    Grossly normal hearing    Neurologic:  Grossly normal    Observations/Objective:  Labs: CMP     Component Value Date/Time   NA 136 05/09/2019 1503   NA 141 05/01/2019 1138   K 4.2 05/09/2019 1503   CL 103 05/09/2019 1503   CO2 27 05/09/2019 1503   GLUCOSE 96 05/09/2019 1503   BUN 11 05/09/2019 1503   BUN 8 05/01/2019 1138   CREATININE 0.82 05/09/2019 1503     CALCIUM 9.3 05/09/2019 1503   PROT 7.2 05/09/2019 1503   PROT 6.7 05/01/2019 1138   ALBUMIN 4.5 05/09/2019 1503   ALBUMIN 4.7 05/01/2019 1138   AST 25 05/09/2019 1503   ALT 38 05/09/2019 1503   ALKPHOS 71 05/09/2019 1503   BILITOT 0.4 05/09/2019 1503   BILITOT 0.4 05/01/2019 1138   GFRNONAA >60 05/09/2019 1503   GFRAA >60 05/09/2019 1503   Lab Results  Component Value Date   WBC 13.6 (H) 05/09/2019   HGB 14.3 05/09/2019   HCT 41.7 05/09/2019   MCV 89.5 05/09/2019   PLT 290 05/09/2019    Imaging Studies: No results found.  Assessment and Plan:   Christopher Chan is a 39 y.o. y/o male here to follow-up for abdominal discomfort ongoing since Thanksgiving.    Significant improvement since starting on Prilosec and increasing the fiber in his diet.  The fact that the symptoms have improved suggest this is unlikely to be related to his gallbladder.  1.  Due to response to Prilosec 40 mg once a day, decrease dose to 20 mg once a day. 2.    Increase dietary fiber with more fruit knots whole grains.  Change over from fiber pills to Citrucel which is a soluble fiber and likely to cause less gas and bloating.  Follow-up in 2 months telephone visit or video visit     I discussed the assessment and treatment plan with the patient. The patient was provided an opportunity to ask questions and all were answered. The patient agreed with the plan and demonstrated an understanding of the instructions.   The patient was advised to call back or seek an in-person evaluation if the symptoms worsen or if the condition fails to improve as anticipated.  I provided 12 minutes of face-to-face time during this encounter.  Dr Jonathon Bellows MD,MRCP [facility]) Gastroenterology/Hepatology Pager: [phone]   Speech recognition software was used to dictate this note.

## 2019-06-20 ENCOUNTER — Telehealth: Payer: Self-pay

## 2019-06-20 ENCOUNTER — Telehealth: Payer: Self-pay | Admitting: Adult Health

## 2019-06-23 ENCOUNTER — Ambulatory Visit: Payer: BC Managed Care – PPO | Admitting: Gastroenterology

## 2020-02-04 IMAGING — DX DG FOREARM 2V*L*
2 series · 2 of 2 positions shown · non-contrast
Comparison: None.

CLINICAL DATA: Bilateral forearm pain after injury moving
furniture.

EXAM:
LEFT FOREARM - 2 VIEW

[forearm ap]
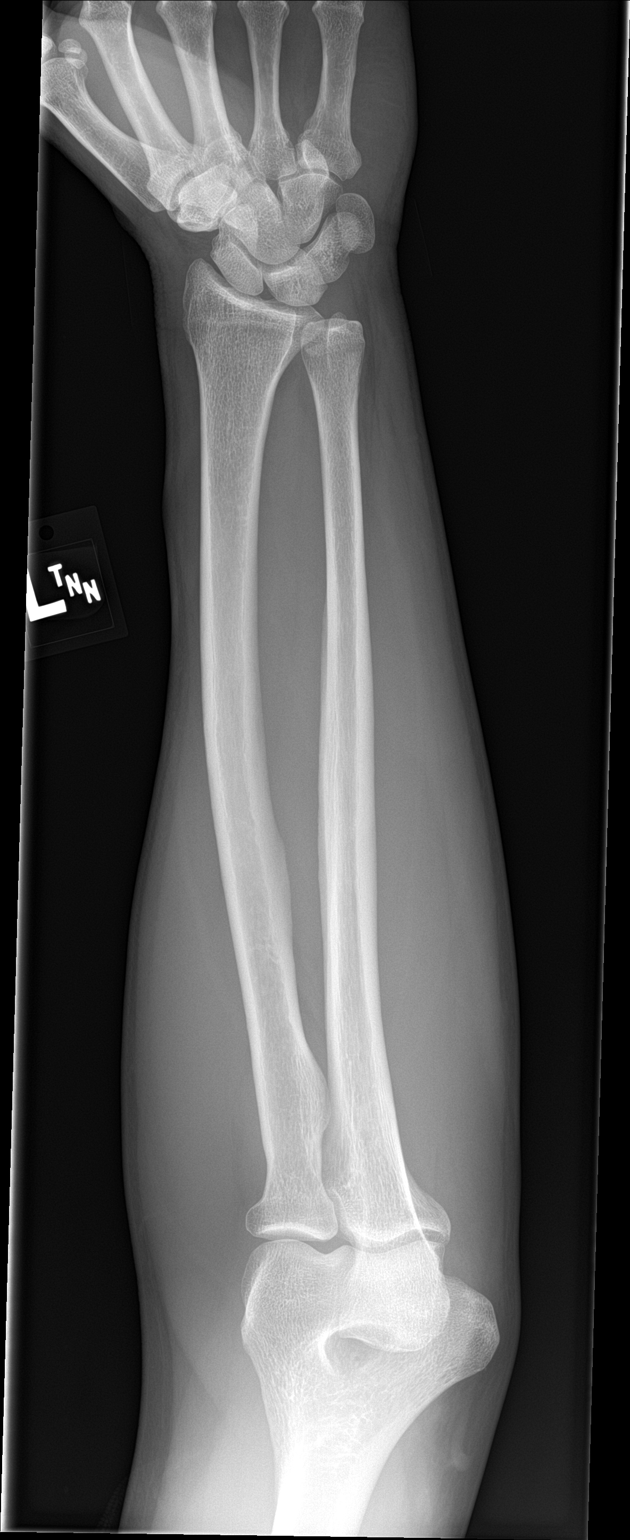

[forearm lat]
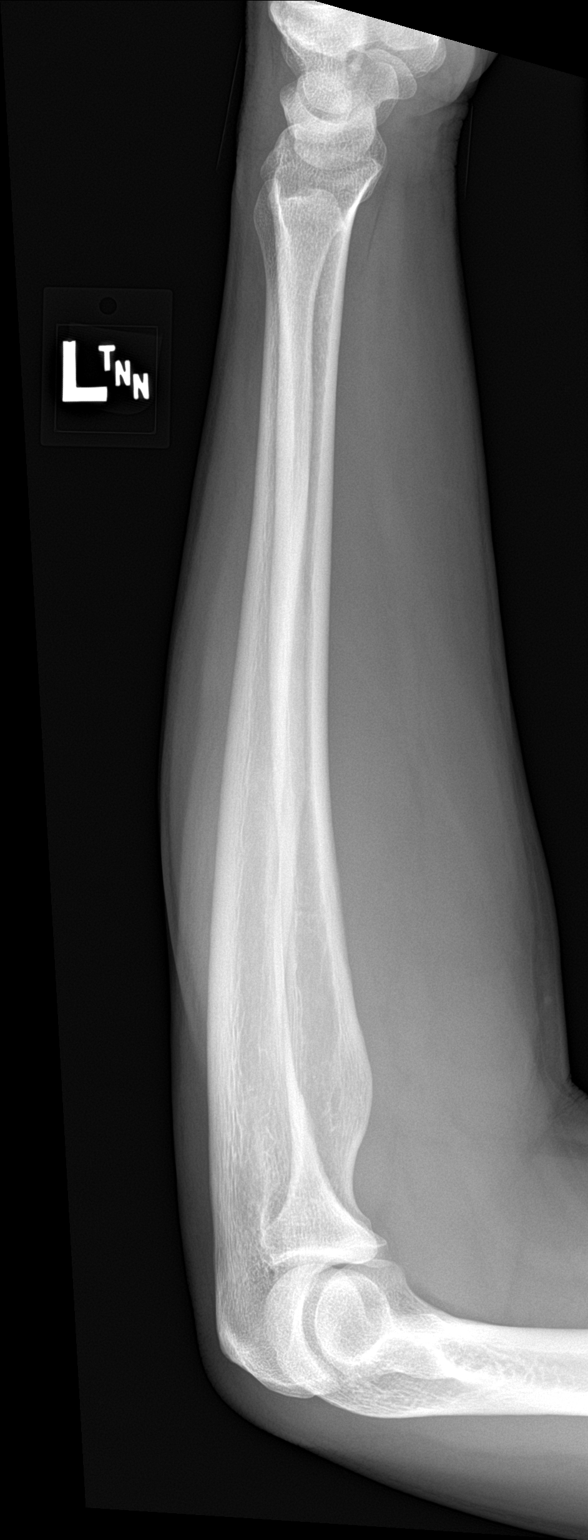

[2 of 2 positions shown; findings below may reference images not displayed]

FINDINGS: There is no evidence of fracture or other focal bone lesions. Soft
tissues are unremarkable.
IMPRESSION: Negative.

## 2020-06-08 ENCOUNTER — Telehealth: Payer: Self-pay | Admitting: Medical

## 2020-06-08 ENCOUNTER — Other Ambulatory Visit: Payer: Self-pay

## 2020-06-08 ENCOUNTER — Ambulatory Visit: Payer: Self-pay

## 2020-06-08 DIAGNOSIS — Z20822 Contact with and (suspected) exposure to covid-19: Secondary | ICD-10-CM

## 2020-06-08 DIAGNOSIS — U071 COVID-19: Secondary | ICD-10-CM

## 2020-06-08 LAB — POC COVID19 BINAXNOW: SARS Coronavirus 2 Ag: POSITIVE — AB

## 2020-06-08 NOTE — Patient Instructions (Signed)

## 2020-06-08 NOTE — Progress Notes (Signed)
Work note completed  Subjective:    Patient ID: Christopher Chan, male    DOB: [birth year], 40 y.o.   MRN: [id number]  HPI 40 yo male in non acute distress stared Friday evening with HA , fever and chills and nasal congestion, bodyaches. I knew I was sick with theses symptoms.   I" feel much better today."   smoker 1ppd +  02 97%  Review of Systems  Constitutional: Positive for chills and fever. Negative for fatigue.  HENT: Positive for congestion, rhinorrhea, sinus pressure, sore throat (mild) and voice change. Negative for ear pain, postnasal drip (on monday only) and sinus pain.   Respiratory: Negative for shortness of breath.   Cardiovascular: Negative for chest pain ( a tiny bit yesterday non now.).  Gastrointestinal: Negative for abdominal pain and diarrhea.  Genitourinary: Negative for difficulty urinating.  Musculoskeletal: Positive for myalgias (Saturday and Sunday).  Skin: Negative for color change and rash.  Neurological: Positive for headaches. Negative for dizziness (friday night and saturday am, none now), syncope and light-headedness.       Objective:   Physical Exam AXOX3 No physical performed due to telemedicine appointment.   Results for orders placed or performed in visit on 06/08/20 (from the past 24 hour(s))  POC COVID-19     Status: Abnormal   Collection Time: 06/08/20  3:58 PM  Result Value Ref Range   SARS Coronavirus 2 Ag Positive (A) Negative      Assessment & Plan:  Covid Positive Covid Screening . Has a  31 month old baby girl. Isolate, rest, increase fluids, OTC Motrin or Tylenol per package. May return to work  06/10/2020 if all symptoms are improving and no fever x 24 hours., Must werar a mask all the time x 5 days after returning to work. Reviewed that family should be tested day 3 exposure and if any symptoms to be tested again. Reviewed with patient to monitor child, If SOB, fever, not eating any thing seems unusual to see  pediatrician. He verbalizes understanding and has no questions at the end of our conversation.

## 2020-06-10 ENCOUNTER — Telehealth: Payer: Self-pay | Admitting: Nurse Practitioner

## 2020-06-10 ENCOUNTER — Other Ambulatory Visit: Payer: Self-pay

## 2020-06-10 DIAGNOSIS — H6693 Otitis media, unspecified, bilateral: Secondary | ICD-10-CM

## 2020-06-10 MED ORDER — AZITHROMYCIN 250 MG PO TABS: ORAL_TABLET | ORAL | 0 refills | Status: DC

## 2020-06-10 NOTE — Progress Notes (Signed)
   Subjective:    Patient ID: Christopher Chan, male    DOB: [birth year], 40 y.o.   MRN: [id number]  HPI  40 year old male, diagnosed with COVID 06/08/2020, has had symptoms for about one week now. Mainly nasal congestion and now with acutely worsening ear pain.   He has been using Advil cold and sinus for relief.   Past Medical History:  Diagnosis Date  . Acid reflux   . Hematuria - cause not known     Review of Systems  HENT: Positive for ear pain and sinus pressure.   Respiratory: Negative.   Cardiovascular: Negative.   Genitourinary: Negative.    Allergies  Allergen Reactions  . Other     Dairy products ,cheese ,  And wheat positive  on prick test - low Eggs - high Box elder , oak trees also high  . Amoxicillin Hives and Rash  . Prednisone Rash       Objective:   Physical Exam  This was a telehealth appointment with patient he was in no acute distress during phone conversation with provider       Assessment & Plan:  Will cover for secondary infection likely AOM  Advised to continue OTC advil cold and sinus as directed  Maintain hydration and adequate caloric intake   RTC with new or worsening symptoms as discussed   Meds ordered this encounter  Medications  . azithromycin (ZITHROMAX) 250 MG tablet    Sig: Take 2 tablets on day 1, then one tablet daily on days 2-5 take with food    Dispense:  6 tablet    Refill:  0

## 2020-10-28 ENCOUNTER — Telehealth: Payer: Self-pay

## 2020-10-28 NOTE — Telephone Encounter (Signed)
Patient called clinic to ask about protocol for close contact for Covid-19. Patient reports daughter tested positive for Covid-19 today. Patient stated he currently has no symptoms of Covid-19. Advised patient that if he began to have symptoms to immediately get covid tested. Informed patient that we could test him on day 3-5 after having close contact with someone who tested positive even if  he is asymptomatic. Advised patient to contact the clinic if he wanted to schedule a Covid test on day 3-5 or if he began to develop symptoms. Advised patient to wear a mask while at work for the next 10 days. Patient confirmed understanding.

## 2020-11-03 ENCOUNTER — Other Ambulatory Visit: Payer: Self-pay

## 2020-11-03 ENCOUNTER — Telehealth: Payer: Self-pay | Admitting: Medical

## 2020-11-03 DIAGNOSIS — U071 COVID-19: Secondary | ICD-10-CM

## 2020-11-03 NOTE — Patient Instructions (Signed)
COVID-19: What to Do if You Are Sick CDC has updated isolation and quarantine recommendations for the public, and is revising the CDC website to reflect these changes. These recommendations do not apply to healthcare personnel and do not supersede state, local, tribal, or territorial laws, rules, andregulations. If you have a fever, cough or other symptoms, you might have COVID-19. Most people have mild illness and are able to recover at home. If you are sick: Keep track of your symptoms. If you have an emergency warning sign (including trouble breathing), call 911. Steps to help prevent the spread of COVID-19 if you are sick If you are sick with COVID-19 or think you might have COVID-19, follow the steps below to care for yourself and to help protect other peoplein your home and community. Stay home except to get medical care Stay home. Most people with COVID-19 have mild illness and can recover at home without medical care. Do not leave your home, except to get medical care. Do not visit public areas. Take care of yourself. Get rest and stay hydrated. Take over-the-counter medicines, such as acetaminophen, to help you feel better. Stay in touch with your doctor. Call before you get medical care. Be sure to get care if you have trouble breathing, or have any other emergency warning signs, or if you think it is an emergency. Avoid public transportation, ride-sharing, or taxis. Separate yourself from other people As much as possible, stay in a specific room and away from other people and pets in your home. If possible, you should use a separate bathroom. If you need to be around other people or animals in oroutside of the home, wear a mask. Tell your close contactsthat they may have been exposed to COVID-19. An infected person can spread COVID-19 starting 48 hours (or 2 days) before the person has any symptoms or tests positive. By letting your close contacts know they may have been exposed to COVID-19,  you are helping to protect everyone. Additional guidance is available for those living in close quarters and shared housing. See COVID-19 and Animals if you have questions about pets. If you are diagnosed with COVID-19, someone from the health department may call you. Answer the call to slow the spread. Monitor your symptoms Symptoms of COVID-19 include fever, cough, or other symptoms. Follow care instructions from your healthcare provider and local health department. Your local health authorities may give instructions on checking your symptoms and reporting information. When to seek emergency medical attention Look for emergency warning signs* for COVID-19. If someone is showing any of these signs, seek emergency medical care immediately: Trouble breathing Persistent pain or pressure in the chest New confusion Inability to wake or stay awake Pale, gray, or blue-colored skin, lips, or nail beds, depending on skin tone *This list is not all possible symptoms. Please call your medical provider forany other symptoms that are severe or concerning to you. Call 911 or call ahead to your local emergency facility: Notify the operator that you are seeking care for someone who has or may haveCOVID-19. Call ahead before visiting your doctor Call ahead. Many medical visits for routine care are being postponed or done by phone or telemedicine. If you have a medical appointment that cannot be postponed, call your doctor's office, and tell them you have or may have COVID-19. This will help the office protect themselves and other patients. Get tested If you have symptoms of COVID-19, get tested. While waiting for test results, you stay away from others,   including staying apart from those living in your household. Self-tests are one of several options for testing for the virus that causes COVID-19 and may be more convenient than laboratory-based tests and point-of-care tests. Ask your healthcare provider or  your local health department if you need help interpreting your test results. You can visit your state, tribal, local, and territorial health department's website to look for the latest local information on testing sites. If you are sick, wear a mask over your nose and mouth You should wear a mask over your nose and mouth if you must be around other people or animals, including pets (even at home). You don't need to wear the mask if you are alone. If you can't put on a mask (because of trouble breathing, for example), cover your coughs and sneezes in some other way. Try to stay at least 6 feet away from other people. This will help protect the people around you. Masks should not be placed on young children under age 2 years, anyone who has trouble breathing, or anyone who is not able to remove the mask without help. Note: During the COVID-19 pandemic, medical grade facemasks are reserved forhealthcare workers and some first responders. Cover your coughs and sneezes Cover your mouth and nose with a tissue when you cough or sneeze. Throw away used tissues in a lined trash can. Immediately wash your hands with soap and water for at least 20 seconds. If soap and water are not available, clean your hands with an alcohol-based hand sanitizer that contains at least 60% alcohol. Clean your hands often Wash your hands often with soap and water for at least 20 seconds. This is especially important after blowing your nose, coughing, or sneezing; going to the bathroom; and before eating or preparing food. Use hand sanitizer if soap and water are not available. Use an alcohol-based hand sanitizer with at least 60% alcohol, covering all surfaces of your hands and rubbing them together until they feel dry. Soap and water are the best option, especially if hands are visibly dirty. Avoid touching your eyes, nose, and mouth with unwashed hands. Handwashing Tips Avoid sharing personal household items Do not share  dishes, drinking glasses, cups, eating utensils, towels, or bedding with other people in your home. Wash these items thoroughly after using them with soap and water or put in the dishwasher. Clean all "high-touch" surfaces every day Clean and disinfect high-touch surfaces in your "sick room" and bathroom; wear disposable gloves. Let someone else clean and disinfect surfaces in common areas, but you should clean your bedroom and bathroom, if possible. If a caregiver or other person needs to clean and disinfect a sick person's bedroom or bathroom, they should do so on an as-needed basis. The caregiver/other person should wear a mask and disposable gloves prior to cleaning. They should wait as long as possible after the person who is sick has used the bathroom before coming in to clean and use the bathroom. High-touch surfaces include phones, remote controls, counters, tabletops, doorknobs, bathroom fixtures, toilets, keyboards, tablets, and bedside tables. Clean and disinfect areas that may have blood, stool, or body fluids on them. Use household cleaners and disinfectants. Clean the area or item with soap and water or another detergent if it is dirty. Then, use a household disinfectant. Be sure to follow the instructions on the label to ensure safe and effective use of the product. Many products recommend keeping the surface wet for several minutes to ensure germs are killed. Many   also recommend precautions such as wearing gloves and making sure you have good ventilation during use of the product. Use a product from EPA's List N: Disinfectants for Coronavirus (COVID-19). Complete Disinfection Guidance When you can be around others after being sick with COVID-19 Deciding when you can be around others is different for different situations. Find out when you can safely end home isolation. For any additional questions about your care,contact your healthcare provider or state or local health  department. 04/28/2020 Content source: National Center for Immunization and Respiratory Diseases (NCIRD), Division of Viral Diseases This information is not intended to replace advice given to you by your health care provider. Make sure you discuss any questions you have with your healthcare provider. Document Revised: 06/25/2020 Document Reviewed: 06/25/2020 Elsevier Patient Education  2022 [employer]vier Inc.  

## 2020-11-03 NOTE — Progress Notes (Signed)
40 yo male in non acute distress consents to telemedicine appointment. tested postiive for Covid-19 at   [place] tested positve  last Thursday.  Friday he did not come into work due to taking dauaghter to ED because she was vomiting.  Wife  with symptoms on Friday and  Dustin with symptoms on Saturday  10 am with fever 101 felt bad with fever. Fever on and off. Sunday fel tlike he was still ill nasal and chest congestion. He has not been tested for Covid-19. He does have an at home test that he can perform the POC testing.   He will test and call me back. Positive Covid-19  test at home.  Review of Systems  Constitutional:  Positive for chills, fever and malaise/fatigue.  HENT:  Positive for congestion, ear pain (right mild) and sore throat.        Post nasal drip , runny nose  Respiratory:  Negative for cough.   Cardiovascular:  Negative for chest pain.  Gastrointestinal:  Positive for abdominal pain (cramping prior to diarrhea) and diarrhea.  Musculoskeletal:  Positive for myalgias.  Neurological:  Positive for headaches. Negative for dizziness.        Negative lightheadedness and  syncope  Endo/Heme/Allergies:  Positive for environmental allergies.    No physical exam performed due to telemedicine appointment. No distress noted on phone call.   Covid-19 infection  Isolate , rest, increase fluids, take OTC Motrin or Tylenol for fever or pain per package instructions. Patient  may return to work on Friday November 05, 2020 if all symptoms are improving and no fever  x 24 hours with no Motrin or Tylenol being taken. Patient verbalizes understanding and has no questions at discharge.

## 2020-11-05 ENCOUNTER — Other Ambulatory Visit: Payer: Self-pay | Admitting: Nurse Practitioner

## 2020-11-05 NOTE — Progress Notes (Signed)
Extended work release, patient is still symptomatic - plan to return Monday 11/08/20 unless symptoms persist  Seek medical follow up for any acutely worsening symptoms

## 2020-12-08 IMAGING — US US ABDOMEN LIMITED
1 series · 14 of 25 positions shown · non-contrast
Comparison: None.

CLINICAL DATA: Upper abdominal pain

EXAM:
ULTRASOUND ABDOMEN LIMITED RIGHT UPPER QUADRANT

[Series 1: us abdomen limited · 0.19mm/px · 14 of 39 slices shown]
[im 1/39]
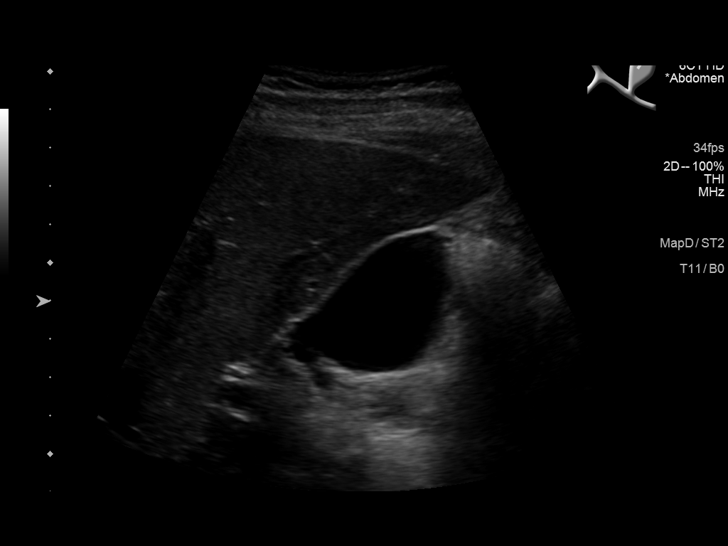
[im 4/39]
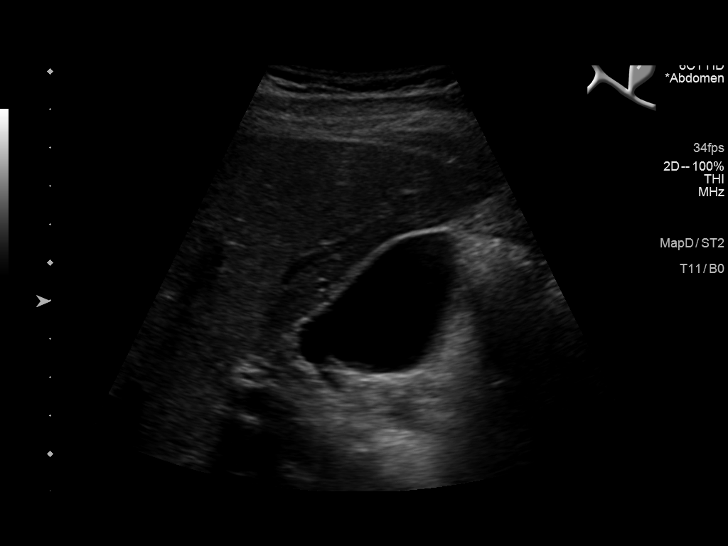
[im 7/39]
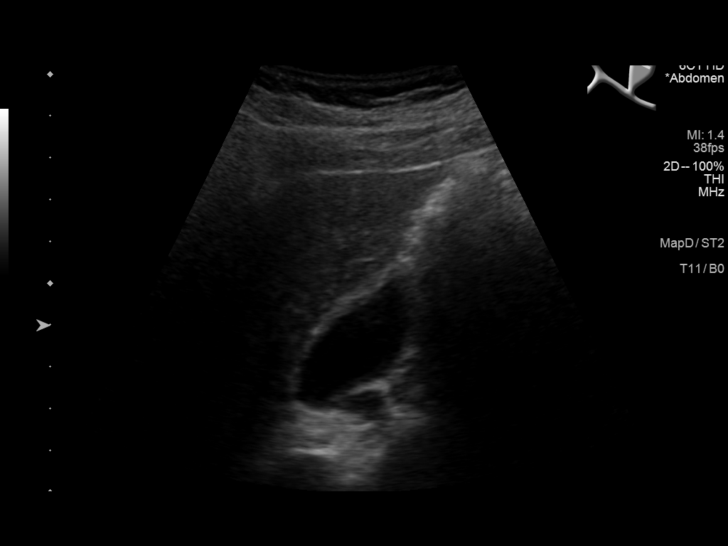
[im 10/39]
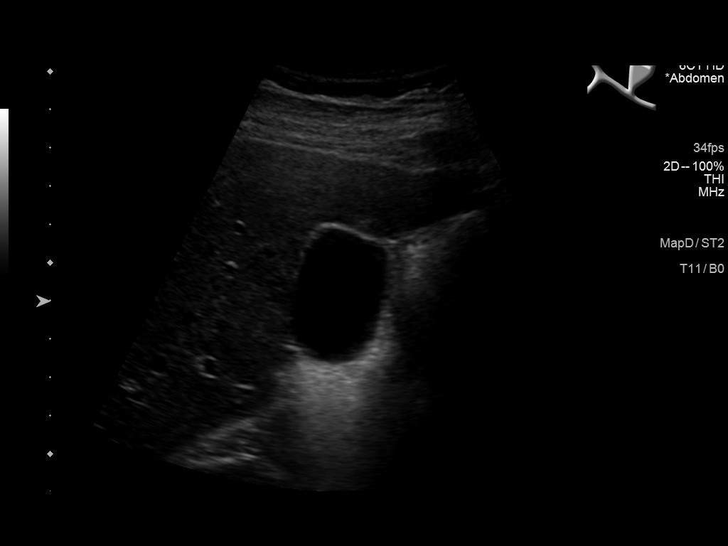
[im 13/39]
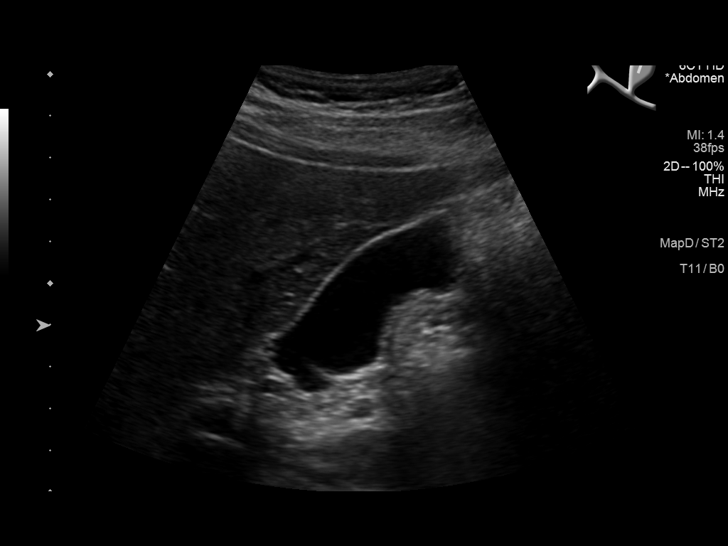
[im 15/39]
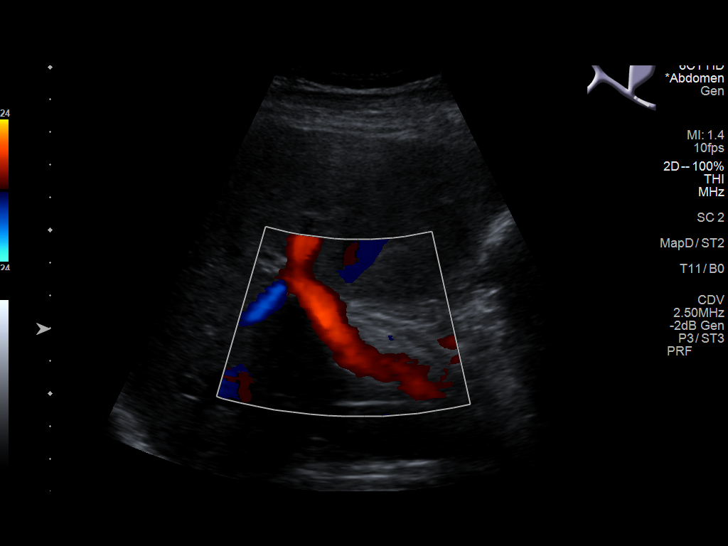
[im 18/39]
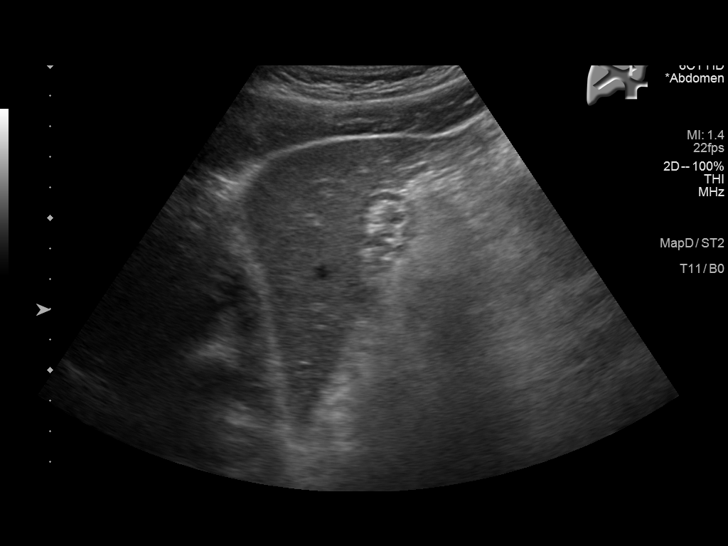
[im 21/39]
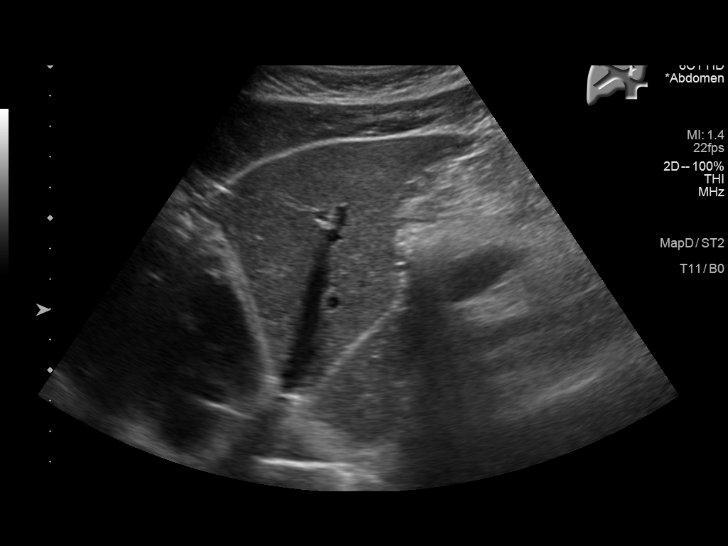
[im 24/39]
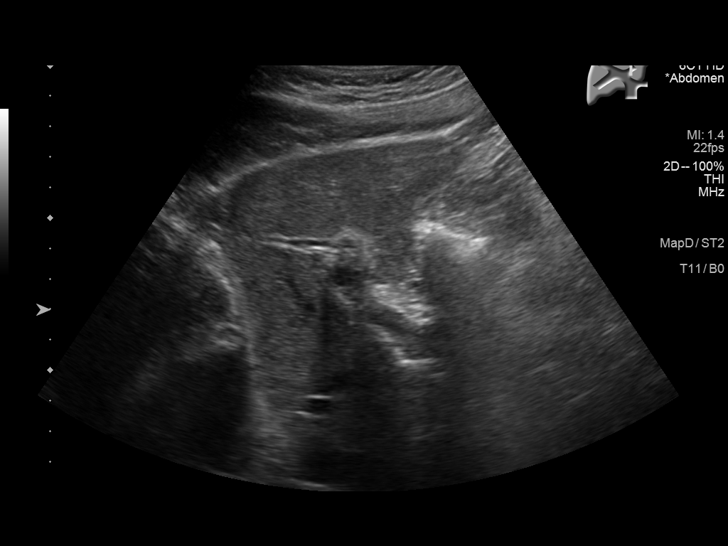
[im 26/39]
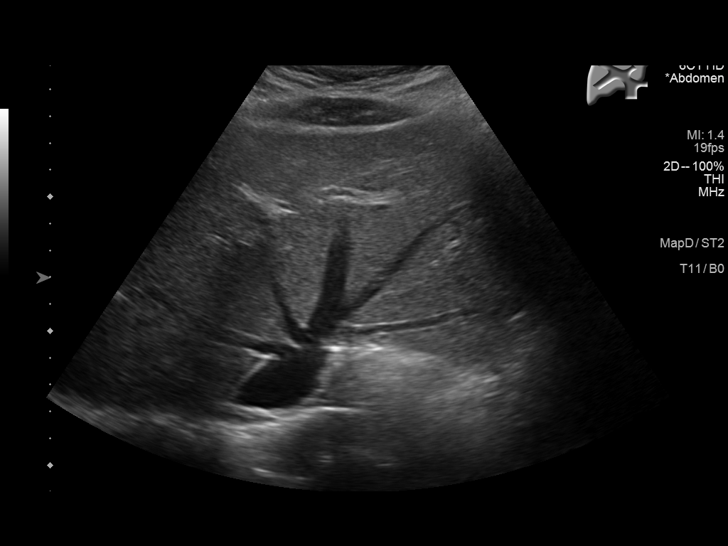
[im 29/39]
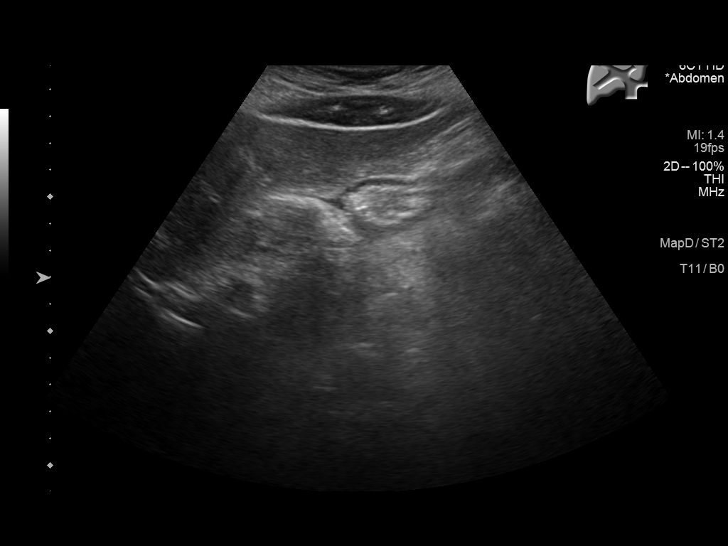
[im 32/39]
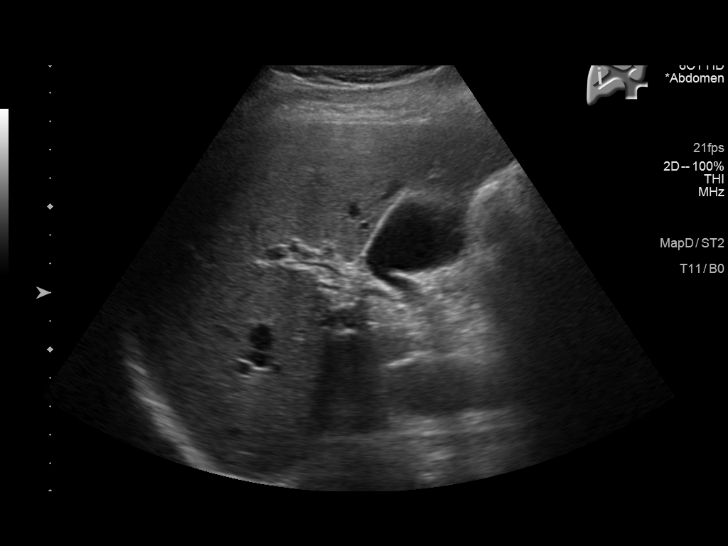
[im 35/39]
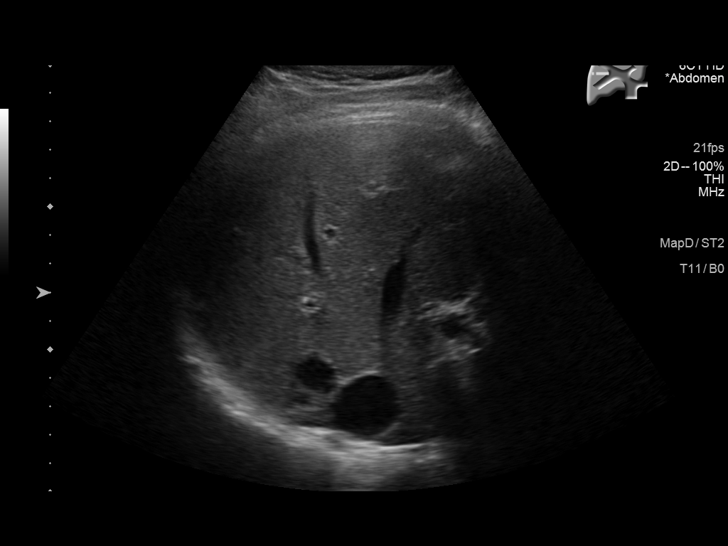
[im 39/39]
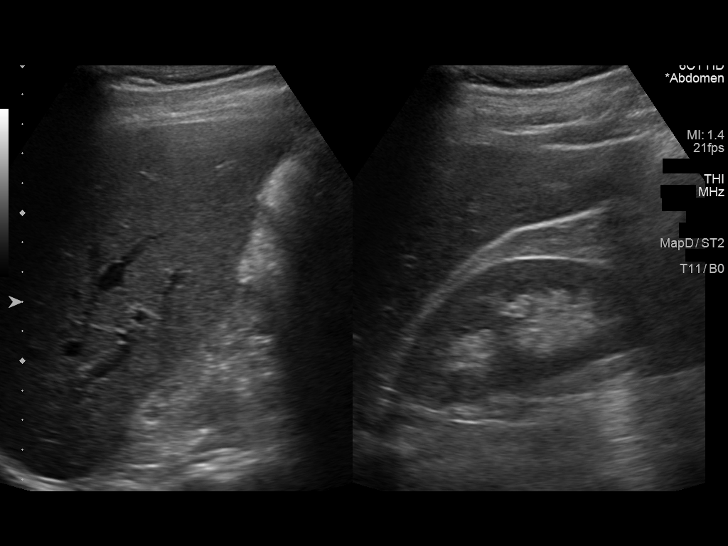

[14 of 25 positions shown; findings below may reference images not displayed]

FINDINGS: Gallbladder:

No gallstones or wall thickening visualized. There is no
pericholecystic fluid. No sonographic Murphy sign noted by
sonographer.

Common bile duct:

Diameter: 3 mm. No intrahepatic or extrahepatic biliary duct
dilatation.

Liver:

No focal lesion identified. Within normal limits in parenchymal
echogenicity. Portal vein is patent on color Doppler imaging with
normal direction of blood flow towards the liver.

Other: None.
IMPRESSION: Study within normal limits.

## 2021-06-07 ENCOUNTER — Other Ambulatory Visit: Payer: Self-pay

## 2021-06-07 ENCOUNTER — Encounter: Payer: Self-pay | Admitting: Medical

## 2021-06-07 ENCOUNTER — Ambulatory Visit: Payer: Self-pay | Admitting: Medical

## 2021-06-07 VITALS — BP 118/70 | HR 51 | Temp 97.6°F | Resp 16

## 2021-06-07 DIAGNOSIS — H6981 Other specified disorders of Eustachian tube, right ear: Secondary | ICD-10-CM

## 2021-06-07 DIAGNOSIS — H6502 Acute serous otitis media, left ear: Secondary | ICD-10-CM

## 2021-06-07 DIAGNOSIS — F17200 Nicotine dependence, unspecified, uncomplicated: Secondary | ICD-10-CM

## 2021-06-07 MED ORDER — AZITHROMYCIN 250 MG PO TABS: ORAL_TABLET | ORAL | 0 refills | Status: DC

## 2021-06-07 NOTE — Progress Notes (Signed)
° °  Subjective:    Patient ID: Christopher Chan, male    DOB: [birth year], 41 y.o.   MRN: [id number]  HPI 41 yo  male in non acute distress , started with cold symptoms,  10 days ago, all symptoms resolved but now having ear pain  right and clogged feeling in the left ear. Denies fever or chills.   Blood pressure 118/70, pulse (!) 51, temperature 97.6 F (36.4 C), temperature source Tympanic, resp. rate 16, SpO2 98 %.  Allergies  Allergen Reactions   Other     Dairy products ,cheese ,  And wheat positive  on prick test - low Eggs - high Box elder , oak trees also high   Amoxicillin Hives and Rash   Prednisone Rash     Review of Systems  Constitutional:  Negative for chills and fever.  HENT:  Positive for ear pain (right ear pain, clogged on left side). Negative for congestion and sore throat.   Respiratory:  Negative for cough.   Cardiovascular:  Negative for chest pain.  Neurological:  Negative for dizziness, syncope, light-headedness and headaches.      Objective:   Physical Exam Vitals and nursing note reviewed.  Constitutional:      Appearance: Normal appearance. He is normal weight.  HENT:     Head: Normocephalic and atraumatic.     Right Ear: Ear canal and external ear normal. A middle ear effusion is present.     Left Ear: Ear canal and external ear normal. Tympanic membrane is erythematous.  Eyes:     Extraocular Movements: Extraocular movements intact.     Conjunctiva/sclera: Conjunctivae normal.     Pupils: Pupils are equal, round, and reactive to light.  Cardiovascular:     Rate and Rhythm: Normal rate and regular rhythm.     Heart sounds: No murmur heard.   No friction rub. No gallop.  Pulmonary:     Effort: Pulmonary effort is normal.     Breath sounds: Normal breath sounds.  Musculoskeletal:        General: Normal range of motion.     Cervical back: Normal range of motion and neck supple.  Skin:    General: Skin is warm and dry.  Neurological:      General: No focal deficit present.     Mental Status: He is alert and oriented to person, place, and time. Mental status is at baseline.  Psychiatric:        Mood and Affect: Mood normal.        Behavior: Behavior normal.        Thought Content: Thought content normal.        Judgment: Judgment normal.          Assessment & Plan:  Otitis media left Eustachian tube dysfunction  Recommend OTC Zyrtec and Flonase x 30 days. Smoker Return in  3-5 days if not improving , patient verbalizes understanding and has no questions at discharge.

## 2022-12-15 ENCOUNTER — Other Ambulatory Visit: Payer: Self-pay

## 2022-12-15 ENCOUNTER — Ambulatory Visit (INDEPENDENT_AMBULATORY_CARE_PROVIDER_SITE_OTHER): Payer: Self-pay | Admitting: Adult Health

## 2022-12-15 ENCOUNTER — Encounter: Payer: Self-pay | Admitting: Adult Health

## 2022-12-15 VITALS — BP 118/78 | HR 79 | Temp 97.6°F

## 2022-12-15 DIAGNOSIS — R0981 Nasal congestion: Secondary | ICD-10-CM

## 2022-12-15 MED ORDER — DOXYCYCLINE HYCLATE 100 MG PO TABS: 100.0000 mg | ORAL_TABLET | Freq: Two times a day (BID) | ORAL | 0 refills | Status: DC

## 2022-12-15 NOTE — Progress Notes (Signed)
[facility] [address] [place], [place] [address]   Office Visit Note  Patient Name: Christopher Chan Date of Birth [id number]  Medical Record number [id number]  Date of Service: 12/15/2022  Chief Complaint  Patient presents with   Ear Pain    States that the last few weeks a tiny bit of pain started then [place] the [place] and maybe got some water in his ear, this week the pain has progressed and now his right ear and side of face is painful, has not used any OTC ear remedies this week, states has been having ear pain on and off since January      HPI Pt is here for a sick visit. He reports some ear pain over the last week.  This morning his right ear is worse.  He describes some "thumping" in that ear and some teeth pain.  Denies fever or chills.  He did spend the weekend in the mountains, and was in a pool and river.    Current Medication:  Outpatient Encounter Medications as of 12/15/2022  Medication Sig   doxycycline (VIBRA-TABS) 100 MG tablet Take 1 tablet (100 mg total) by mouth 2 (two) times daily.   EPINEPHrine 0.3 mg/0.3 mL IJ SOAJ injection Inject 0.3 mg into the muscle once.   [DISCONTINUED] azithromycin (ZITHROMAX Z-PAK) 250 MG tablet Take 2 tablets by mouth, today then one tablet days  2-5, take with food.   [DISCONTINUED] omeprazole (PRILOSEC) 40 MG capsule Take 1 capsule (40 mg total) by mouth daily.   No facility-administered encounter medications on file as of 12/15/2022.      Medical History: Past Medical History:  Diagnosis Date   Acid reflux    Hematuria - cause not known      Vital Signs: BP 118/78   Pulse 79   Temp 97.6 F (36.4 C) (Tympanic)   SpO2 96%    Review of Systems  Constitutional:  Negative for chills and fatigue.  HENT:  Positive for ear pain and sinus pressure. Negative for sore throat.   Eyes:  Negative for pain and itching.  Respiratory:  Negative for cough.   Cardiovascular:  Negative for chest pain.     Physical Exam Vitals and nursing note reviewed.  Constitutional:      Appearance: Normal appearance.  HENT:     Head: Normocephalic.     Left Ear: Tympanic membrane and ear canal normal.     Nose:     Right Turbinates: Enlarged.     Left Turbinates: Enlarged.     Right Sinus: Maxillary sinus tenderness and frontal sinus tenderness present.     Left Sinus: No maxillary sinus tenderness or frontal sinus tenderness.     Mouth/Throat:     Mouth: Mucous membranes are moist.  Eyes:     Pupils: Pupils are equal, round, and reactive to light.  Pulmonary:     Effort: Pulmonary effort is normal.  Lymphadenopathy:     Cervical: No cervical adenopathy.  Neurological:     Mental Status: He is alert.    Assessment/Plan: 1. Sinus congestion Patient Instructions: -Take complete course of antibiotics as prescribed.  Take with food.  -Try Flonase/Fluticasone nasal spray, 2 sprays to each nostril once a day. -You can try using a neti pot or nasal saline rinse product to help clear mucus congestion. -Rest and stay well hydrated (by drinking water and other liquids). Avoid/limit caffeine. -Take over-the-counter medicines (i.e. Mucinex, decongestant, Ibuprofen or Tylenol, cough  suppressant) to help relieve your symptoms. -For your cough, use cough drops/throat lozenges, gargle warm salt water and/or drink warm liquids (like tea with honey). -Send my chart message to provider or schedule return visit as needed for new/worsening symptoms or if symptoms do not improve as discussed with antibiotic and other recommended treatment.   - doxycycline (VIBRA-TABS) 100 MG tablet; Take 1 tablet (100 mg total) by mouth 2 (two) times daily.  Dispense: 20 tablet; Refill: 0     General Counseling: Christopher Chan verbalizes understanding of the findings of todays visit and agrees with plan of treatment. I have discussed any further diagnostic evaluation that may be needed or ordered today. We also reviewed his  medications today. he has been encouraged to call the office with any questions or concerns that should arise related to todays visit.   No orders of the defined types were placed in this encounter.   Meds ordered this encounter  Medications   doxycycline (VIBRA-TABS) 100 MG tablet    Sig: Take 1 tablet (100 mg total) by mouth 2 (two) times daily.    Dispense:  20 tablet    Refill:  0    Time spent:15 Minutes    Johnna Acosta AGNP-C Nurse Practitioner

## 2023-11-24 ENCOUNTER — Emergency Department (HOSPITAL_COMMUNITY)
Admission: EM | Admit: 2023-11-24 | Discharge: 2023-11-25 | Disposition: A | Discharge status: Home / Self Care | Attending: Student | Admitting: Student

## 2023-11-24 ENCOUNTER — Encounter (HOSPITAL_COMMUNITY): Payer: Self-pay | Admitting: *Deleted

## 2023-11-24 ENCOUNTER — Other Ambulatory Visit: Payer: Self-pay

## 2023-11-24 DIAGNOSIS — R109 Unspecified abdominal pain: Secondary | ICD-10-CM

## 2023-11-24 DIAGNOSIS — R1031 Right lower quadrant pain: Secondary | ICD-10-CM | POA: Diagnosis present

## 2023-11-24 LAB — URINALYSIS, ROUTINE W REFLEX MICROSCOPIC
Bilirubin Urine: NEGATIVE
Glucose, UA: NEGATIVE mg/dL
Hgb urine dipstick: NEGATIVE
Ketones, ur: NEGATIVE mg/dL
Leukocytes,Ua: NEGATIVE
Nitrite: NEGATIVE
Protein, ur: NEGATIVE mg/dL
Specific Gravity, Urine: 1.014 (ref 1.005–1.030)
pH: 5 (ref 5.0–8.0)

## 2023-11-24 LAB — CBC
HCT: 39.7 % (ref 39.0–52.0)
Hemoglobin: 13.4 g/dL (ref 13.0–17.0)
MCH: 30.9 pg (ref 26.0–34.0)
MCHC: 33.8 g/dL (ref 30.0–36.0)
MCV: 91.7 fL (ref 80.0–100.0)
Platelets: 272 K/uL (ref 150–400)
RBC: 4.33 MIL/uL (ref 4.22–5.81)
RDW: 12.3 % (ref 11.5–15.5)
WBC: 9.5 K/uL (ref 4.0–10.5)
nRBC: 0 % (ref 0.0–0.2)

## 2023-11-24 MED ORDER — OXYCODONE-ACETAMINOPHEN 5-325 MG PO TABS
1.0000 | ORAL_TABLET | Freq: Once | ORAL | Status: AC
  Administered 2023-11-24: 1 via ORAL
  Filled 2023-11-24: qty 1

## 2023-11-24 NOTE — ED Triage Notes (Signed)
 The pt is c/o rt flank pain since this afternoon  no bloody urine no frequency

## 2023-11-25 ENCOUNTER — Emergency Department (HOSPITAL_COMMUNITY)

## 2023-11-25 LAB — LIPASE, BLOOD: Lipase: 31 U/L (ref 11–51)

## 2023-11-25 LAB — COMPREHENSIVE METABOLIC PANEL WITH GFR
ALT: 31 U/L (ref 0–44)
AST: 24 U/L (ref 15–41)
Albumin: 3.8 g/dL (ref 3.5–5.0)
Alkaline Phosphatase: 54 U/L (ref 38–126)
Anion gap: 10 (ref 5–15)
BUN: 9 mg/dL (ref 6–20)
CO2: 22 mmol/L (ref 22–32)
Calcium: 9.4 mg/dL (ref 8.9–10.3)
Chloride: 107 mmol/L (ref 98–111)
Creatinine, Ser: 1.21 mg/dL (ref 0.61–1.24)
GFR, Estimated: >60 mL/min (ref >60)
Glucose, Bld: 117 mg/dL — ABNORMAL HIGH (ref 70–99)
Potassium: 4 mmol/L (ref 3.5–5.1)
Sodium: 139 mmol/L (ref 135–145)
Total Bilirubin: 0.5 mg/dL (ref 0.0–1.2)
Total Protein: 6.1 g/dL — ABNORMAL LOW (ref 6.5–8.1)

## 2023-11-25 MED ORDER — IOHEXOL 350 MG/ML SOLN
75.0000 mL | Freq: Once | INTRAVENOUS | Status: AC | PRN
  Administered 2023-11-25: 75 mL via INTRAVENOUS

## 2023-11-25 MED ORDER — HYDROMORPHONE HCL 1 MG/ML IJ SOLN
0.5000 mg | Freq: Once | INTRAMUSCULAR | Status: AC
  Administered 2023-11-25: 0.5 mg via INTRAVENOUS
  Filled 2023-11-25: qty 1

## 2023-11-25 MED ORDER — OXYCODONE-ACETAMINOPHEN 5-325 MG PO TABS: 1.0000 | ORAL_TABLET | Freq: Four times a day (QID) | ORAL | 0 refills | Status: DC | PRN

## 2023-11-25 MED ORDER — KETOROLAC TROMETHAMINE 15 MG/ML IJ SOLN
15.0000 mg | Freq: Once | INTRAMUSCULAR | Status: AC
  Administered 2023-11-25: 15 mg via INTRAVENOUS
  Filled 2023-11-25: qty 1

## 2023-11-25 MED ORDER — TAMSULOSIN HCL 0.4 MG PO CAPS: 0.4000 mg | ORAL_CAPSULE | Freq: Every day | ORAL | 0 refills | Status: AC

## 2023-11-25 MED ORDER — ONDANSETRON HCL 4 MG/2ML IJ SOLN
4.0000 mg | Freq: Once | INTRAMUSCULAR | Status: AC
  Administered 2023-11-25: 4 mg via INTRAVENOUS
  Filled 2023-11-25: qty 2

## 2023-11-25 NOTE — ED Notes (Signed)
Patient returned to CT.

## 2023-11-25 NOTE — ED Provider Notes (Signed)
 [place] EMERGENCY DEPARTMENT AT [facility] Provider Note   CSN: [id number] Arrival date & time: 11/24/23  2300     Patient presents with: Flank Pain   Christopher Chan is a 43 y.o. male who presents with concern for sudden onset right side pain/flank pain that started while he was at a cookout with friends.  No history of same.  Pain was intermittently very intense, patient is fairly comfortable appearing at time of my evaluation.  No pain in the genitals per patient, no hematuria, urinary frequency or urgency.  Does report some pressure in the suprapubic area.  Does have a history of kidney stones in the past though he states that at that time he had little pain associated with it.  No known medical diagnoses aside from acid reflux for which he is on omeprazole .   HPI     Prior to Admission medications   Medication Sig Start Date End Date Taking? Authorizing Provider  oxyCODONE -acetaminophen  (PERCOCET/ROXICET) 5-325 MG tablet Take 1 tablet by mouth every 6 (six) hours as needed for severe pain (pain score 7-10). 11/25/23  Yes Rodneshia Greenhouse R, PA-C  tamsulosin  (FLOMAX ) 0.4 MG CAPS capsule Take 1 capsule (0.4 mg total) by mouth daily for 15 days. 11/25/23 12/10/23 Yes Kealani Leckey, Pleasant SAUNDERS, PA-C  doxycycline  (VIBRA -TABS) 100 MG tablet Take 1 tablet (100 mg total) by mouth 2 (two) times daily. 12/15/22   Scarboro, Adam J, NP  EPINEPHrine 0.3 mg/0.3 mL IJ SOAJ injection Inject 0.3 mg into the muscle once.    [provider]    Allergies: Other, Amoxicillin, and Prednisone     Review of Systems  Constitutional: Negative.   Gastrointestinal: Negative.   Genitourinary:  Positive for flank pain. Negative for decreased urine volume, difficulty urinating, frequency, hematuria, penile discharge, penile pain, penile swelling, scrotal swelling, testicular pain and urgency.    Updated Vital Signs BP 111/84   Pulse (!) 40   Temp 97.6 F (36.4 C)   Resp 16   Ht 5'  9 (1.753 m)   Wt 78.3 kg   SpO2 97%   BMI 25.49 kg/m   Physical Exam Vitals and nursing note reviewed.  Constitutional:      Appearance: He is not ill-appearing or toxic-appearing.  HENT:     Head: Normocephalic and atraumatic.     Mouth/Throat:     Mouth: Mucous membranes are moist.     Pharynx: No oropharyngeal exudate or posterior oropharyngeal erythema.  Eyes:     General:        Right eye: No discharge.        Left eye: No discharge.     Conjunctiva/sclera: Conjunctivae normal.  Cardiovascular:     Rate and Rhythm: Normal rate and regular rhythm.     Pulses: Normal pulses.     Heart sounds: Normal heart sounds. No murmur heard. Pulmonary:     Effort: Pulmonary effort is normal. No respiratory distress.     Breath sounds: Normal breath sounds. No wheezing or rales.  Abdominal:     General: Bowel sounds are normal. There is no distension.     Palpations: Abdomen is soft.     Tenderness: There is abdominal tenderness in the right lower quadrant and suprapubic area. There is no right CVA tenderness, left CVA tenderness, guarding or rebound.  Musculoskeletal:        General: No deformity.     Cervical back: Neck supple.  Skin:    General: Skin is warm  and dry.     Capillary Refill: Capillary refill takes less than 2 seconds.  Neurological:     General: No focal deficit present.     Mental Status: He is alert and oriented to person, place, and time. Mental status is at baseline.  Psychiatric:        Mood and Affect: Mood normal.     (all labs ordered are listed, but only abnormal results are displayed) Labs Reviewed  COMPREHENSIVE METABOLIC PANEL WITH GFR - Abnormal; Notable for the following components:      Result Value   Glucose, Bld 117 (*)    Total Protein 6.1 (*)    All other components within normal limits  LIPASE, BLOOD  CBC  URINALYSIS, ROUTINE W REFLEX MICROSCOPIC    EKG: None  Radiology: CT ABDOMEN PELVIS W CONTRAST Result Date:  11/25/2023 CLINICAL DATA:  RLQ abdominal pain EXAM: CT ABDOMEN AND PELVIS WITH CONTRAST TECHNIQUE: Multidetector CT imaging of the abdomen and pelvis was performed using the standard protocol following bolus administration of intravenous contrast. RADIATION DOSE REDUCTION: This exam was performed according to the departmental dose-optimization program which includes automated exposure control, adjustment of the mA and/or kV according to patient size and/or use of iterative reconstruction technique. CONTRAST:  75mL OMNIPAQUE  IOHEXOL  350 MG/ML SOLN COMPARISON:  Ultrasound abdomen 05/09/2019 FINDINGS: Lower chest: Circumferential thickening of distal esophagus. Hepatobiliary: No focal liver abnormality. No gallstones, gallbladder wall thickening, or pericholecystic fluid. No biliary dilatation. Pancreas: No focal lesion. Normal pancreatic contour. No surrounding inflammatory changes. No main pancreatic ductal dilatation. Spleen: Normal in size without focal abnormality. Adrenals/Urinary Tract: No adrenal nodule bilaterally. Nonspecific calcifications of the bilateral adrenal glands. Bilateral kidneys enhance symmetrically. No hydronephrosis. No hydroureter. The urinary bladder is unremarkable. Stomach/Bowel: Stomach is within normal limits. No evidence of bowel wall thickening or dilatation. Appendix appears normal. Vascular/Lymphatic: No abdominal aorta or iliac aneurysm. No abdominal, pelvic, or inguinal lymphadenopathy. Reproductive: Prostate is unremarkable. Left spermatic cord lipomatous hypertrophy. Other: No intraperitoneal free fluid. No intraperitoneal free gas. No organized fluid collection. Musculoskeletal: No abdominal wall hernia or abnormality. No suspicious lytic or blastic osseous lesions. No acute displaced fracture. IMPRESSION: 1. Circumferential thickening of distal esophagus. Correlate with signs and symptoms of esophagitis. 2. Left spermatic cord lipomatous hypertrophy. 3.   Nonspecific  calcifications of the bilateral adrenal glands. Electronically Signed   By: Morgane  Naveau M.D.   On: 11/25/2023 02:07     Procedures   Medications Ordered in the ED  oxyCODONE -acetaminophen  (PERCOCET/ROXICET) 5-325 MG per tablet 1 tablet (1 tablet Oral Given 11/24/23 2329)  iohexol  (OMNIPAQUE ) 350 MG/ML injection 75 mL (75 mLs Intravenous Contrast Given 11/25/23 0157)  HYDROmorphone  (DILAUDID ) injection 0.5 mg (0.5 mg Intravenous Given 11/25/23 0313)  ondansetron  (ZOFRAN ) injection 4 mg (4 mg Intravenous Given 11/25/23 0314)  HYDROmorphone  (DILAUDID ) injection 0.5 mg (0.5 mg Intravenous Given 11/25/23 0323)  ketorolac  (TORADOL ) 15 MG/ML injection 15 mg (15 mg Intravenous Given 11/25/23 0419)    Clinical Course as of 11/25/23 0620  Sun Nov 25, 2023  0317 Sudden onset worsening pain lower on the right side; clinically consistent with ureterolithiasis. Pain likely secondary to migration of the stone toward the bladder. Analgesia and antiemetic ordered.  [RS]    Clinical Course User Index [RS] Ambriella Kitt, Pleasant SAUNDERS, PA-C                                 Medical Decision  Making 43 year old male presents with concern for right flank pain.  Vitals are reassuring intake.  Cardiopulmonary exam is unremarkable, abdominal exam is with mild right lower quadrant tenderness palpation and suprapubic pressure without true pain on palpation.  No CVAT.  The differential diagnosis of emergent flank pain includes, but is not limited to: Nephrolithiasis/ Renal Colic, Pyelonephritis, Abdominal aortic aneurysm, Aortic dissection, Renal artery embolism, Renal vein thrombosis, Renal infarction, Renal hemorrhage, Mesenteric ischemia, Bladder tumor, Cystitis, Biliary colic, Pancreatitis, Perforated peptic ulcer,  Appendicitis, Inguinal Hernia, Diverticulitis, Bowel obstruction. Shingles, Lower lobe pneumonia, Retroperitoneal hematoma/abscess/tumor, Epidural abscess, Epidural hematoma.  In males, it is important to consider  (Testicular torsion, Epididymitis, STD)    Amount and/or Complexity of Data Reviewed Labs:     Details: CBC unremarkable, CMP unremarkable, lipase is normal, UA is without evidence of infection.   Radiology: ordered.    Details:  CT abdomen pelvis obtained with contrast given lack of renal function changes on labs or hematuria.  Circumferential thickening of the distal esophagus without symptoms of esophagitis per patient.  Known GERD on omeprazole  with outpatient follow-up.  No obvious ureterolithiasis.  Risk Prescription drug management.   Clinical picture is most consistent with ureterolithiasis, patient subsequently became much more symptomatic with pain migrating down into the lower right abdomen and pelvis with transient radiation of pain into the scrotum.  Patient had sudden relief of pain; suspect these episodes of pain related to migration of a ureteral stone through the UVJ and into the bladder.  Will treat presumptively for ureterolithiasis with Flomax  and breakthrough pain medication.  Recommend increased hydration and close outpatient follow-up.  Strict return precautions were given.  Clinical concern for emergent underlying condition that would warrant further ED workup and patient management is exceedingly low.  Christopher Chan and his wife voiced understanding of his medical evaluation and treatment plan. Each of their questions answered to their expressed satisfaction.  Return precautions were given.  Patient is well-appearing, stable, and was discharged in good condition.  This chart was dictated using voice recognition software, Dragon. Despite the best efforts of this provider to proofread and correct errors, errors may still occur which can change documentation meaning.      Final diagnoses:  Flank pain    ED Discharge Orders          Ordered    tamsulosin  (FLOMAX ) 0.4 MG CAPS capsule  Daily        11/25/23 0558    oxyCODONE -acetaminophen  (PERCOCET/ROXICET) 5-325 MG tablet   Every 6 hours PRN        11/25/23 0558               Rosabel Sermeno, Pleasant SAUNDERS, PA-C 11/25/23 0620    Albertina Dixon, MD 11/25/23 [phone]

## 2023-11-25 NOTE — ED Notes (Signed)
 Patient transported to CT

## 2023-11-25 NOTE — Discharge Instructions (Addendum)
 You were seen in the ER today for your Side pain.  Your blood work, labs and CT scan were very reassuring.  Clinically your symptoms are most consistent with a kidney stone.  It appears that you may have passed a kidney stone into your bladder while you are in the emergency department.  Your prescribed medication to help you pass the stone completely as well as as needed pain medication.  You may follow with your primary care doctor and return to the ER with any fevers or chills, worsening abdominal pain, or any other severe symptoms.

## 2023-12-17 ENCOUNTER — Other Ambulatory Visit: Payer: Self-pay | Admitting: Cardiology

## 2023-12-17 DIAGNOSIS — R9439 Abnormal result of other cardiovascular function study: Secondary | ICD-10-CM

## 2023-12-17 DIAGNOSIS — E7800 Pure hypercholesterolemia, unspecified: Secondary | ICD-10-CM

## 2023-12-17 DIAGNOSIS — Z720 Tobacco use: Secondary | ICD-10-CM

## 2023-12-17 DIAGNOSIS — R0789 Other chest pain: Secondary | ICD-10-CM

## 2023-12-17 DIAGNOSIS — Z8249 Family history of ischemic heart disease and other diseases of the circulatory system: Secondary | ICD-10-CM

## 2023-12-19 ENCOUNTER — Ambulatory Visit: Admitting: Urology

## 2023-12-28 ENCOUNTER — Telehealth (HOSPITAL_COMMUNITY): Payer: Self-pay | Admitting: Emergency Medicine

## 2023-12-28 ENCOUNTER — Encounter (HOSPITAL_COMMUNITY): Payer: Self-pay

## 2023-12-28 NOTE — Telephone Encounter (Signed)
 Reaching out to patient to offer assistance regarding upcoming cardiac imaging study; pt verbalizes understanding of appt date/time, parking situation and where to check in, pre-test NPO status and medications ordered, and verified current allergies; name and call back number provided for further questions should they arise Christopher Shutter RN Navigator Cardiac Imaging [facility] [phone] office [phone] cell  Pt unsure what HR runs on daily, charted as 45-74bpm. Not given premeds

## 2023-12-31 ENCOUNTER — Ambulatory Visit
Admission: RE | Admit: 2023-12-31 | Discharge: 2023-12-31 | Disposition: A | Discharge status: Home / Self Care | Source: Ambulatory Visit | Attending: Cardiology | Admitting: Cardiology

## 2023-12-31 DIAGNOSIS — E7800 Pure hypercholesterolemia, unspecified: Secondary | ICD-10-CM | POA: Diagnosis present

## 2023-12-31 DIAGNOSIS — Z8249 Family history of ischemic heart disease and other diseases of the circulatory system: Secondary | ICD-10-CM | POA: Insufficient documentation

## 2023-12-31 DIAGNOSIS — R9439 Abnormal result of other cardiovascular function study: Secondary | ICD-10-CM | POA: Diagnosis present

## 2023-12-31 DIAGNOSIS — R0789 Other chest pain: Secondary | ICD-10-CM | POA: Insufficient documentation

## 2023-12-31 DIAGNOSIS — Z720 Tobacco use: Secondary | ICD-10-CM | POA: Diagnosis present

## 2023-12-31 MED ORDER — DILTIAZEM HCL 25 MG/5ML IV SOLN: 10.0000 mg | INTRAVENOUS | Status: DC | PRN

## 2023-12-31 MED ORDER — IOHEXOL 350 MG/ML SOLN
100.0000 mL | Freq: Once | INTRAVENOUS | Status: AC | PRN
  Administered 2023-12-31 (×2): 100 mL via INTRAVENOUS

## 2023-12-31 MED ORDER — NITROGLYCERIN 0.4 MG SL SUBL
0.8000 mg | SUBLINGUAL_TABLET | Freq: Once | SUBLINGUAL | Status: AC
  Administered 2023-12-31 (×2): 0.4 mg via SUBLINGUAL

## 2023-12-31 MED ORDER — METOPROLOL TARTRATE 5 MG/5ML IV SOLN: 10.0000 mg | INTRAVENOUS | Status: DC | PRN

## 2023-12-31 NOTE — Progress Notes (Signed)
 Patient tolerated procedure well. Ambulate w/o difficulty. Denies any lightheadedness or being dizzy. Pt denies any pain at this time. Sitting in chair. Pt is encouraged to drink additional water throughout the day and reason explained to patient. Patient verbalized understanding and all questions answered. ABC intact. No further needs at this time. Discharge from procedure area w/o issues.

## 2024-02-27 ENCOUNTER — Ambulatory Visit: Attending: Cardiology | Admitting: Internal Medicine

## 2024-02-27 ENCOUNTER — Encounter: Payer: Self-pay | Admitting: Internal Medicine

## 2024-02-27 VITALS — BP 100/60 | HR 58 | Ht 69.0 in | Wt 188.0 lb

## 2024-02-27 DIAGNOSIS — R0789 Other chest pain: Secondary | ICD-10-CM

## 2024-02-27 NOTE — Progress Notes (Signed)
 OFFICE NOTE  Chief Complaint:  Evaluate chest pain  Primary Care Physician: [facility]  HPI:  Christopher Chan is a 43 y.o. male with a past medial history significant for family history of coronary artery disease in both parents, as well as tobacco use, hyperlipidemia, but otherwise no known cardiovascular disease.  He had an episode of chest discomfort and underwent treadmill stress testing in July 2025 which showed some ST depression in the inferior lateral leads.  An echo showed normal biventricular systolic function.  Subsequently he underwent a coronary CT angiogram in August 2025 which showed no coronary disease and 0 coronary calcium.  He had follow-up with Dr. Wilburn at [facility] cardiology on August 21.  These details were reviewed with him.  I apparently he had a readymade appointment to see me since we follow his family.  This today was for a second opinion.  He continues to feel well without any chest pain or worsening shortness of breath.  He is working on decreasing his tobacco use which was suggested.  I reviewed the prior studies personally and agree with those assessments and recommendations  PMHx:  Past Medical History:  Diagnosis Date   Acid reflux    Hematuria - cause not known     Past Surgical History:  Procedure Laterality Date   HERNIA REPAIR      FAMHx:  Family History  Problem Relation Age of Onset   Heart attack Mother        75s   Breast cancer Mother    Healthy Father     SOCHx:   reports that he has been smoking cigarettes. He has never used smokeless tobacco. He reports current alcohol use. He reports that he does not use drugs.  ALLERGIES:  Allergies  Allergen Reactions   Other     Dairy products ,cheese ,  And wheat positive  on prick test - low Eggs - high Box elder , oak trees also high   Amoxicillin Hives and Rash    ROS: Pertinent items noted in HPI and remainder of comprehensive ROS otherwise negative.  HOME  MEDS: Current Outpatient Medications on File Prior to Visit  Medication Sig Dispense Refill   albuterol (VENTOLIN HFA) 108 (90 Base) MCG/ACT inhaler Inhale 2 puffs into the lungs.     Methylcellulose, Laxative, 500 MG TABS Take 500 mg by mouth daily as needed (constipation).     tamsulosin  (FLOMAX ) 0.4 MG CAPS capsule Take 0.4 mg by mouth daily.     No current facility-administered medications on file prior to visit.    LABS/IMAGING: No results found for this or any previous visit (from the past 48 hours). No results found.  LIPID PANEL: No results found for: CHOL, TRIG, HDL, CHOLHDL, VLDL, LDLCALC, LDLDIRECT   WEIGHTS: Wt Readings from Last 3 Encounters:  02/27/24 188 lb (85.3 kg)  11/24/23 172 lb 9.9 oz (78.3 kg)  05/19/19 172 lb 9.6 oz (78.3 kg)    VITALS: BP 100/60   Pulse (!) 58   Ht 5' 9 (1.753 m)   Wt 188 lb (85.3 kg)   SpO2 97%   BMI 27.76 kg/m   EXAM: Deferred  EKG: Deferred  ASSESSMENT: Atypical chest pain with 0 coronary artery calcification and no coronary artery disease by CT Tobacco use  PLAN: 1.   Mr. Adebayo has described somewhat of atypical chest pain however his testing initially was abnormal followed up by subsequent coronary artery CT angiography which was definitively negative.  No coronary calcium or coronary disease was noted.  He does have family history and risk factors and therefore should continue to work on primary prevention.  He does have a primary care provider at the [place] [facility].  I have encouraged him to continue to work on tobacco cessation.  Otherwise I agree completely with the workup thus far and am happy to see him back on an as needed basis.  Vinie KYM Maxcy, MD, [facility], FNLA, FACP  [place]  [facility]  Medical Director of the Advanced Lipid Disorders &  Cardiovascular Risk Reduction Clinic Diplomate of the American Board of Clinical Lipidology Attending Cardiologist  Direct Dial: [phone]   Fax: [phone]  Website:  [email or web address]   Vinie BROCKS Quaid Yeakle 02/27/2024, 3:59 PM

## 2024-02-27 NOTE — Patient Instructions (Signed)
 Medication Instructions:  Your physician recommends that you continue on your current medications as directed. Please refer to the Current Medication list given to you today.  *If you need a refill on your cardiac medications before your next appointment, please call your pharmacy*  Lab Work: None.  If you have labs (blood work) drawn today and your tests are completely normal, you will receive your results only by: MyChart Message (if you have MyChart) OR A paper copy in the mail If you have any lab test that is abnormal or we need to change your treatment, we will call you to review the results.  Testing/Procedures: None.  Follow-Up: At [facility], you and your health needs are our priority.  As part of our continuing mission to provide you with exceptional heart care, our providers are all part of one team.  This team includes your primary Cardiologist (physician) and Advanced Practice Providers or APPs (Physician Assistants and Nurse Practitioners) who all work together to provide you with the care you need, when you need it.  Your next appointment will be as needed and it will be with:     Provider:   Dr. Mona, MD   We recommend signing up for the patient portal called MyChart.  Sign up information is provided on this After Visit Summary.  MyChart is used to connect with patients for Virtual Visits (Telemedicine).  Patients are able to view lab/test results, encounter notes, upcoming appointments, etc.  Non-urgent messages can be sent to your provider as well.   To learn more about what you can do with MyChart, go to [email or web address].
